# Patient Record
Sex: Female | Born: 1950 | Race: White | Hispanic: No | Smoking: Never smoker
Health system: Southern US, Community
[De-identification: ages and names within clinical notes are randomized; demographics above are authoritative.]

## PROBLEM LIST (undated history)

## (undated) DIAGNOSIS — K219 Gastro-esophageal reflux disease without esophagitis: Secondary | ICD-10-CM

## (undated) DIAGNOSIS — R112 Nausea with vomiting, unspecified: Secondary | ICD-10-CM

## (undated) DIAGNOSIS — R001 Bradycardia, unspecified: Secondary | ICD-10-CM

## (undated) DIAGNOSIS — E119 Type 2 diabetes mellitus without complications: Secondary | ICD-10-CM

## (undated) DIAGNOSIS — R0609 Other forms of dyspnea: Secondary | ICD-10-CM

## (undated) DIAGNOSIS — R790 Abnormal level of blood mineral: Secondary | ICD-10-CM

## (undated) DIAGNOSIS — F319 Bipolar disorder, unspecified: Secondary | ICD-10-CM

## (undated) DIAGNOSIS — I10 Essential (primary) hypertension: Secondary | ICD-10-CM

## (undated) DIAGNOSIS — F419 Anxiety disorder, unspecified: Secondary | ICD-10-CM

## (undated) DIAGNOSIS — I2510 Atherosclerotic heart disease of native coronary artery without angina pectoris: Secondary | ICD-10-CM

## (undated) DIAGNOSIS — E041 Nontoxic single thyroid nodule: Secondary | ICD-10-CM

## (undated) DIAGNOSIS — I491 Atrial premature depolarization: Secondary | ICD-10-CM

## (undated) DIAGNOSIS — Z9889 Other specified postprocedural states: Secondary | ICD-10-CM

## (undated) DIAGNOSIS — G930 Cerebral cysts: Secondary | ICD-10-CM

## (undated) DIAGNOSIS — E538 Deficiency of other specified B group vitamins: Secondary | ICD-10-CM

## (undated) DIAGNOSIS — I493 Ventricular premature depolarization: Secondary | ICD-10-CM

## (undated) DIAGNOSIS — Z87442 Personal history of urinary calculi: Secondary | ICD-10-CM

## (undated) DIAGNOSIS — R519 Headache, unspecified: Secondary | ICD-10-CM

## (undated) DIAGNOSIS — G4730 Sleep apnea, unspecified: Secondary | ICD-10-CM

## (undated) DIAGNOSIS — K760 Fatty (change of) liver, not elsewhere classified: Secondary | ICD-10-CM

## (undated) DIAGNOSIS — E7800 Pure hypercholesterolemia, unspecified: Secondary | ICD-10-CM

## (undated) DIAGNOSIS — R0600 Dyspnea, unspecified: Secondary | ICD-10-CM

## (undated) DIAGNOSIS — F32A Depression, unspecified: Secondary | ICD-10-CM

## (undated) DIAGNOSIS — R002 Palpitations: Secondary | ICD-10-CM

## (undated) DIAGNOSIS — R42 Dizziness and giddiness: Secondary | ICD-10-CM

## (undated) DIAGNOSIS — J45909 Unspecified asthma, uncomplicated: Secondary | ICD-10-CM

## (undated) DIAGNOSIS — M1990 Unspecified osteoarthritis, unspecified site: Secondary | ICD-10-CM

## (undated) HISTORY — PX: CHOLECYSTECTOMY: SHX55

## (undated) HISTORY — DX: Pure hypercholesterolemia, unspecified: E78.00

## (undated) HISTORY — PX: BRAIN SURGERY: SHX531

## (undated) HISTORY — PX: TONSILLECTOMY: SUR1361

## (undated) HISTORY — DX: Gastro-esophageal reflux disease without esophagitis: K21.9

## (undated) HISTORY — PX: KIDNEY STONE SURGERY: SHX686

## (undated) HISTORY — PX: LASIK: SHX215

## (undated) HISTORY — PX: CRANIOTOMY: SHX93

## (undated) HISTORY — PX: OTHER SURGICAL HISTORY: SHX169

## (undated) HISTORY — PX: ELBOW SURGERY: SHX618

## (undated) HISTORY — PX: JOINT REPLACEMENT: SHX530

## (undated) HISTORY — PX: ABDOMINAL HYSTERECTOMY: SHX81

---

## 1974-09-05 HISTORY — PX: TUBAL LIGATION: SHX77

## 1996-09-05 HISTORY — PX: BREAST SURGERY: SHX581

## 2004-09-05 HISTORY — PX: COLONOSCOPY: SHX174

## 2005-01-13 ENCOUNTER — Ambulatory Visit: Payer: Self-pay | Admitting: Family Medicine

## 2005-01-13 ENCOUNTER — Encounter: Payer: Self-pay | Admitting: Internal Medicine

## 2005-08-05 ENCOUNTER — Ambulatory Visit: Payer: Self-pay | Admitting: Gastroenterology

## 2006-03-29 ENCOUNTER — Ambulatory Visit: Payer: Self-pay | Admitting: Internal Medicine

## 2007-01-17 ENCOUNTER — Emergency Department: Payer: Self-pay | Admitting: Unknown Physician Specialty

## 2007-01-17 ENCOUNTER — Other Ambulatory Visit: Payer: Self-pay

## 2007-01-23 ENCOUNTER — Telehealth (INDEPENDENT_AMBULATORY_CARE_PROVIDER_SITE_OTHER): Payer: Self-pay | Admitting: *Deleted

## 2007-02-01 ENCOUNTER — Ambulatory Visit: Payer: Self-pay | Admitting: Internal Medicine

## 2007-02-01 DIAGNOSIS — E785 Hyperlipidemia, unspecified: Secondary | ICD-10-CM

## 2007-02-01 DIAGNOSIS — I10 Essential (primary) hypertension: Secondary | ICD-10-CM | POA: Insufficient documentation

## 2007-02-01 DIAGNOSIS — R609 Edema, unspecified: Secondary | ICD-10-CM

## 2007-02-01 DIAGNOSIS — E042 Nontoxic multinodular goiter: Secondary | ICD-10-CM

## 2007-02-01 DIAGNOSIS — G43009 Migraine without aura, not intractable, without status migrainosus: Secondary | ICD-10-CM | POA: Insufficient documentation

## 2007-02-02 LAB — CONVERTED CEMR LAB
Albumin: 4.1 g/dL (ref 3.5–5.2)
BUN: 11 mg/dL (ref 6–23)
GFR calc Af Amer: 112 mL/min
GFR calc non Af Amer: 93 mL/min
Hgb A1c MFr Bld: 6.1 % — ABNORMAL HIGH (ref 4.6–6.0)
Phosphorus: 4.5 mg/dL (ref 2.3–4.6)
Potassium: 4.5 meq/L (ref 3.5–5.1)
Total CHOL/HDL Ratio: 4.8
Triglycerides: 297 mg/dL (ref 0–149)

## 2007-02-13 ENCOUNTER — Ambulatory Visit: Payer: Self-pay | Admitting: Internal Medicine

## 2007-02-26 ENCOUNTER — Encounter (INDEPENDENT_AMBULATORY_CARE_PROVIDER_SITE_OTHER): Payer: Self-pay | Admitting: *Deleted

## 2007-07-09 ENCOUNTER — Telehealth: Payer: Self-pay | Admitting: Internal Medicine

## 2007-07-09 DIAGNOSIS — N200 Calculus of kidney: Secondary | ICD-10-CM

## 2007-07-11 ENCOUNTER — Encounter: Payer: Self-pay | Admitting: Internal Medicine

## 2007-08-06 ENCOUNTER — Ambulatory Visit: Payer: Self-pay | Admitting: Internal Medicine

## 2007-08-20 ENCOUNTER — Ambulatory Visit: Payer: Self-pay | Admitting: Internal Medicine

## 2007-08-20 DIAGNOSIS — R42 Dizziness and giddiness: Secondary | ICD-10-CM

## 2007-09-12 ENCOUNTER — Ambulatory Visit: Payer: Self-pay | Admitting: Internal Medicine

## 2007-09-13 LAB — CONVERTED CEMR LAB
Albumin: 3.7 g/dL (ref 3.5–5.2)
Creatinine, Ser: 0.7 mg/dL (ref 0.4–1.2)
GFR calc Af Amer: 112 mL/min
GFR calc non Af Amer: 93 mL/min
LDL Cholesterol: 93 mg/dL (ref 0–99)
Phosphorus: 3.2 mg/dL (ref 2.3–4.6)
Potassium: 3.7 meq/L (ref 3.5–5.1)
Sodium: 141 meq/L (ref 135–145)
Total CHOL/HDL Ratio: 3.2
Triglycerides: 112 mg/dL (ref 0–149)
VLDL: 22 mg/dL (ref 0–40)

## 2007-09-30 ENCOUNTER — Emergency Department: Payer: Self-pay | Admitting: Emergency Medicine

## 2007-10-01 ENCOUNTER — Encounter: Admission: RE | Admit: 2007-10-01 | Discharge: 2007-10-01 | Payer: Self-pay | Admitting: Neurosurgery

## 2007-10-01 ENCOUNTER — Encounter: Payer: Self-pay | Admitting: Internal Medicine

## 2007-10-02 ENCOUNTER — Encounter: Payer: Self-pay | Admitting: Internal Medicine

## 2007-11-14 ENCOUNTER — Encounter: Payer: Self-pay | Admitting: Internal Medicine

## 2007-11-19 ENCOUNTER — Telehealth (INDEPENDENT_AMBULATORY_CARE_PROVIDER_SITE_OTHER): Payer: Self-pay | Admitting: *Deleted

## 2007-11-27 ENCOUNTER — Ambulatory Visit: Payer: Self-pay | Admitting: Internal Medicine

## 2007-11-27 DIAGNOSIS — G930 Cerebral cysts: Secondary | ICD-10-CM | POA: Insufficient documentation

## 2008-01-07 ENCOUNTER — Ambulatory Visit: Payer: Self-pay | Admitting: Internal Medicine

## 2008-01-07 DIAGNOSIS — R7301 Impaired fasting glucose: Secondary | ICD-10-CM

## 2008-02-25 ENCOUNTER — Telehealth (INDEPENDENT_AMBULATORY_CARE_PROVIDER_SITE_OTHER): Payer: Self-pay | Admitting: *Deleted

## 2008-02-27 ENCOUNTER — Ambulatory Visit: Payer: Self-pay | Admitting: Internal Medicine

## 2008-02-27 ENCOUNTER — Encounter: Payer: Self-pay | Admitting: Internal Medicine

## 2008-03-05 ENCOUNTER — Telehealth: Payer: Self-pay | Admitting: Internal Medicine

## 2008-03-05 DIAGNOSIS — R928 Other abnormal and inconclusive findings on diagnostic imaging of breast: Secondary | ICD-10-CM | POA: Insufficient documentation

## 2008-03-10 ENCOUNTER — Ambulatory Visit: Payer: Self-pay | Admitting: Internal Medicine

## 2008-03-10 ENCOUNTER — Encounter: Payer: Self-pay | Admitting: Internal Medicine

## 2008-03-11 ENCOUNTER — Encounter (INDEPENDENT_AMBULATORY_CARE_PROVIDER_SITE_OTHER): Payer: Self-pay | Admitting: *Deleted

## 2008-04-25 ENCOUNTER — Ambulatory Visit: Payer: Self-pay | Admitting: Family Medicine

## 2008-04-28 ENCOUNTER — Ambulatory Visit: Payer: Self-pay | Admitting: Internal Medicine

## 2008-04-28 DIAGNOSIS — S6980XA Other specified injuries of unspecified wrist, hand and finger(s), initial encounter: Secondary | ICD-10-CM

## 2008-04-28 DIAGNOSIS — S6990XA Unspecified injury of unspecified wrist, hand and finger(s), initial encounter: Secondary | ICD-10-CM | POA: Insufficient documentation

## 2008-05-02 ENCOUNTER — Ambulatory Visit: Payer: Self-pay | Admitting: Internal Medicine

## 2008-07-11 ENCOUNTER — Ambulatory Visit: Payer: Self-pay | Admitting: Internal Medicine

## 2008-07-11 DIAGNOSIS — N3000 Acute cystitis without hematuria: Secondary | ICD-10-CM | POA: Insufficient documentation

## 2008-07-11 LAB — CONVERTED CEMR LAB
Bilirubin Urine: NEGATIVE
Blood in Urine, dipstick: NEGATIVE
Urobilinogen, UA: 0.2
WBC Urine, dipstick: NEGATIVE

## 2008-07-14 LAB — CONVERTED CEMR LAB
Albumin: 4.2 g/dL (ref 3.5–5.2)
Alkaline Phosphatase: 78 units/L (ref 39–117)
CO2: 32 meq/L (ref 19–32)
Chloride: 103 meq/L (ref 96–112)
Creatinine, Ser: 0.8 mg/dL (ref 0.4–1.2)
GFR calc non Af Amer: 79 mL/min
LDL Cholesterol: 79 mg/dL (ref 0–99)
Sodium: 140 meq/L (ref 135–145)
Total CHOL/HDL Ratio: 3
Triglycerides: 90 mg/dL (ref 0–149)

## 2008-08-05 ENCOUNTER — Ambulatory Visit: Payer: Self-pay | Admitting: Internal Medicine

## 2008-08-06 ENCOUNTER — Telehealth: Payer: Self-pay | Admitting: Internal Medicine

## 2008-08-19 ENCOUNTER — Encounter: Payer: Self-pay | Admitting: Internal Medicine

## 2008-08-25 ENCOUNTER — Ambulatory Visit: Payer: Self-pay | Admitting: Internal Medicine

## 2008-08-25 DIAGNOSIS — G479 Sleep disorder, unspecified: Secondary | ICD-10-CM | POA: Insufficient documentation

## 2008-09-05 DIAGNOSIS — G930 Cerebral cysts: Secondary | ICD-10-CM

## 2008-09-05 HISTORY — DX: Cerebral cysts: G93.0

## 2008-09-05 HISTORY — PX: BRAIN SURGERY: SHX531

## 2008-10-09 ENCOUNTER — Ambulatory Visit: Payer: Self-pay | Admitting: Internal Medicine

## 2008-10-09 DIAGNOSIS — F32A Depression, unspecified: Secondary | ICD-10-CM | POA: Insufficient documentation

## 2008-10-09 DIAGNOSIS — F329 Major depressive disorder, single episode, unspecified: Secondary | ICD-10-CM

## 2008-10-09 DIAGNOSIS — R1013 Epigastric pain: Secondary | ICD-10-CM

## 2008-10-13 ENCOUNTER — Ambulatory Visit: Payer: Self-pay | Admitting: Internal Medicine

## 2008-10-16 ENCOUNTER — Encounter (INDEPENDENT_AMBULATORY_CARE_PROVIDER_SITE_OTHER): Payer: Self-pay | Admitting: *Deleted

## 2008-10-23 ENCOUNTER — Ambulatory Visit: Payer: Self-pay | Admitting: Internal Medicine

## 2008-10-29 ENCOUNTER — Encounter: Payer: Self-pay | Admitting: Internal Medicine

## 2008-10-29 ENCOUNTER — Emergency Department: Payer: Self-pay | Admitting: Internal Medicine

## 2008-10-31 ENCOUNTER — Ambulatory Visit: Payer: Self-pay | Admitting: Internal Medicine

## 2008-10-31 ENCOUNTER — Encounter: Payer: Self-pay | Admitting: Internal Medicine

## 2008-11-05 ENCOUNTER — Encounter: Payer: Self-pay | Admitting: Internal Medicine

## 2008-11-06 ENCOUNTER — Telehealth: Payer: Self-pay | Admitting: Internal Medicine

## 2008-11-10 ENCOUNTER — Ambulatory Visit: Payer: Self-pay | Admitting: Internal Medicine

## 2008-11-11 LAB — CONVERTED CEMR LAB
Free T4: 1 ng/dL (ref 0.6–1.6)
TSH: 1.13 microintl units/mL (ref 0.35–5.50)

## 2009-01-09 ENCOUNTER — Emergency Department: Payer: Self-pay | Admitting: Emergency Medicine

## 2009-01-13 ENCOUNTER — Ambulatory Visit: Payer: Self-pay | Admitting: Gastroenterology

## 2009-02-11 ENCOUNTER — Ambulatory Visit: Payer: Self-pay | Admitting: Family Medicine

## 2009-03-10 ENCOUNTER — Ambulatory Visit: Payer: Self-pay | Admitting: Family Medicine

## 2009-03-12 ENCOUNTER — Ambulatory Visit: Payer: Self-pay | Admitting: Gastroenterology

## 2009-03-19 ENCOUNTER — Ambulatory Visit: Payer: Self-pay | Admitting: Gastroenterology

## 2009-06-23 ENCOUNTER — Inpatient Hospital Stay: Payer: Self-pay | Admitting: Internal Medicine

## 2009-08-12 ENCOUNTER — Emergency Department: Payer: Self-pay | Admitting: Emergency Medicine

## 2009-09-03 ENCOUNTER — Emergency Department: Payer: Self-pay | Admitting: Emergency Medicine

## 2009-10-01 ENCOUNTER — Ambulatory Visit: Payer: Self-pay | Admitting: Neurology

## 2010-06-09 ENCOUNTER — Ambulatory Visit: Payer: Self-pay | Admitting: Family Medicine

## 2011-06-14 ENCOUNTER — Ambulatory Visit: Payer: Self-pay | Admitting: Family Medicine

## 2012-12-25 ENCOUNTER — Ambulatory Visit: Payer: Self-pay | Admitting: Family Medicine

## 2013-01-07 ENCOUNTER — Ambulatory Visit: Payer: Self-pay | Admitting: Family Medicine

## 2013-01-22 ENCOUNTER — Other Ambulatory Visit: Payer: Self-pay

## 2013-01-22 ENCOUNTER — Encounter: Payer: Self-pay | Admitting: General Surgery

## 2013-01-22 ENCOUNTER — Ambulatory Visit (INDEPENDENT_AMBULATORY_CARE_PROVIDER_SITE_OTHER): Payer: Medicare Other | Admitting: General Surgery

## 2013-01-22 VITALS — BP 140/76 | HR 72 | Resp 12 | Ht 64.0 in | Wt 150.0 lb

## 2013-01-22 DIAGNOSIS — N63 Unspecified lump in unspecified breast: Secondary | ICD-10-CM

## 2013-01-22 HISTORY — PX: BREAST BIOPSY: SHX20

## 2013-01-22 NOTE — Patient Instructions (Addendum)

## 2013-01-22 NOTE — Progress Notes (Signed)
Patient ID: Kayla Wade, female   DOB: [birth year], 62 y.o.   MRN: [id number]  Chief Complaint  Patient presents with  . abnormal mammogram    category 4 mammogram and ultrasound    HPI Kayla Wade is a 62 y.o. female who presents for an abnormal mammogram and ultrasound. The most recent mammogram was done on 01/07/13 cat 4. Patient states she get regular mammograms and perform self breast checks.Had a right breast biopsy 15 years ago.No family history of breast cancer. HPI  Past Medical History  Diagnosis Date  . High cholesterol   . Acid reflux     Past Surgical History  Procedure Laterality Date  . Brain surgery    . Tonsillectomy    . Abdominal hysterectomy    . Elbow surgery    . Cholecystectomy    . Breast surgery Right 1998    right breast biopsy    History reviewed. No pertinent family history.  Social History History  Substance Use Topics  . Smoking status: Never Smoker   . Smokeless tobacco: Never Used  . Alcohol Use: No    Allergies  Allergen Reactions  . Erythromycin Base   . Hydrocod Polst-Cpm Polst Er     Current Outpatient Prescriptions  Medication Sig Dispense Refill  . atorvastatin (LIPITOR) 20 MG tablet Take 20 mg by mouth daily.      . propranolol (INDERAL) 20 MG tablet Take 20 mg by mouth 3 (three) times daily.      . risperiDONE (RISPERDAL) 2 MG tablet Take 2 mg by mouth at bedtime.      . trihexyphenidyl (ARTANE) 5 MG tablet Take 5 mg by mouth 2 (two) times daily with a meal.       No current facility-administered medications for this visit.    Review of Systems Review of Systems  Constitutional: Negative.   Respiratory: Negative.   Cardiovascular: Negative.     Blood pressure 140/76, pulse 72, resp. rate 12, height 5\' 4"  (1.626 m), weight 150 lb (68.04 kg).  Physical Exam Physical Exam  Constitutional: She is oriented to person, place, and time. She appears well-developed and well-nourished.  Eyes: Conjunctivae are normal. No scleral  icterus.  Neck: Neck supple.  Cardiovascular: Normal rate, regular rhythm and normal heart sounds.   Pulmonary/Chest: Breath sounds normal. Right breast exhibits no inverted nipple, no mass, no nipple discharge, no skin change and no tenderness. Left breast exhibits no inverted nipple, no mass, no nipple discharge, no skin change and no tenderness. Breasts are symmetrical.  Abdominal: Soft. Bowel sounds are normal. There is no hepatosplenomegaly. There is no tenderness.  Lymphadenopathy:    She has no cervical adenopathy.    She has no axillary adenopathy.  Neurological: She is alert and oriented to person, place, and time.  Skin: Skin is warm and dry.    Data Reviewed Mammogram and [place] reviewed. Focal small mass with shadowing noted inferior left breast  Assessment    Left breasyt mass, needs core biopsy     Plan    Biopsy completed today with pt's consent      CORE BREAST BIOPSY REPORT  Name:  Kayla Wade DOB:  06-Jan-1951  Vital signs:BP 140/76  Pulse 72  Resp 12  Ht 5\' 4"  (1.626 m)  Wt 150 lb (68.04 kg)  BMI 25.73 kg/m2  Lesion:  Mass left breast  Location:  Left   5-6 o'clock position  Local anesthetic:   8 ml of 1% Xylocaine/0.5% Marcaine  Prep:  Chloroprep  Device:  14Gauge  Bard    Ultrasound guidance:  used Patient tolerance:Patient tolerated the procedure well   Approach: LM  Clip:used  Dressing: steristrips, telfa tegaderm  Ice pack applied. Written instructions provided to patient regarding wound care.   Patient advised that patient will be contacted by phone when pathology report is available.  Followup appointment to be scheduled after pathology.   CC: Kayla Fabry, MD, Kayla Wade G 01/23/2013, 5:21 PM

## 2013-01-23 ENCOUNTER — Encounter: Payer: Self-pay | Admitting: General Surgery

## 2013-01-23 DIAGNOSIS — N63 Unspecified lump in unspecified breast: Secondary | ICD-10-CM | POA: Insufficient documentation

## 2013-01-24 ENCOUNTER — Telehealth: Payer: Self-pay | Admitting: *Deleted

## 2013-01-24 ENCOUNTER — Other Ambulatory Visit: Payer: Self-pay | Admitting: *Deleted

## 2013-01-24 DIAGNOSIS — N63 Unspecified lump in unspecified breast: Secondary | ICD-10-CM

## 2013-01-24 LAB — PATHOLOGY

## 2013-01-24 NOTE — Telephone Encounter (Signed)
Message copied by Currie Paris on Thu Jan 24, 2013 12:47 PM ------      Message from: Kieth Brightly      Created: Thu Jan 24, 2013 11:14 AM       Inform pt pathology on core biopsy is benign. Need to see her back in 2 mos with left mammogram. ------

## 2013-01-24 NOTE — Telephone Encounter (Signed)
Notified patient as instructed, patient pleased. Discussed follow-up appointments, patient agrees Elon Jester will call with appointments, pt prefers any day but after 2pm

## 2013-01-24 NOTE — Progress Notes (Signed)
The patient has been asked to return to the office in two months for a unilateral left breast diagnostic mammogram. This has been arranged at [facility] for 03-27-13 at 2:30 pm. Patient to follow up in the office on 04-04-13. She is aware of dates, times, and instructions.

## 2013-03-27 ENCOUNTER — Ambulatory Visit: Payer: Self-pay | Admitting: General Surgery

## 2013-04-04 ENCOUNTER — Other Ambulatory Visit: Payer: Self-pay

## 2013-04-04 ENCOUNTER — Encounter: Payer: Self-pay | Admitting: General Surgery

## 2013-04-04 ENCOUNTER — Ambulatory Visit (INDEPENDENT_AMBULATORY_CARE_PROVIDER_SITE_OTHER): Payer: Medicare Other | Admitting: General Surgery

## 2013-04-04 VITALS — BP 116/78 | HR 62 | Resp 12 | Ht 64.0 in | Wt 149.0 lb

## 2013-04-04 DIAGNOSIS — N63 Unspecified lump in unspecified breast: Secondary | ICD-10-CM

## 2013-04-04 NOTE — Patient Instructions (Addendum)
Follow up appointment to be announced.  

## 2013-04-04 NOTE — Progress Notes (Signed)
Patient ID: Kayla Wade, female   DOB: 30-Mar-1951, 62 y.o.   MRN: [id number]  Chief Complaint  Patient presents with  . Follow-up    mammogram    HPI Kayla Wade is a 62 y.o. female.  Patient here today for follow up left mammogram on 03/27/13 . Patient states she is doing well.She had core biopsy of a vague mass 5-6 o'cl in left breast-benign finding.  HPI  Past Medical History  Diagnosis Date  . High cholesterol   . Acid reflux     Past Surgical History  Procedure Laterality Date  . Brain surgery    . Tonsillectomy    . Abdominal hysterectomy    . Elbow surgery    . Cholecystectomy    . Breast surgery Right 1998    right breast biopsy    No family history on file.  Social History History  Substance Use Topics  . Smoking status: Never Smoker   . Smokeless tobacco: Never Used  . Alcohol Use: No    Allergies  Allergen Reactions  . Erythromycin Base   . Hydrocod Polst-Cpm Polst Er     Current Outpatient Prescriptions  Medication Sig Dispense Refill  . atorvastatin (LIPITOR) 20 MG tablet Take 20 mg by mouth daily.      . propranolol (INDERAL) 20 MG tablet Take 20 mg by mouth 3 (three) times daily.      . risperiDONE (RISPERDAL) 2 MG tablet Take 2 mg by mouth at bedtime.      . trihexyphenidyl (ARTANE) 5 MG tablet Take 5 mg by mouth 2 (two) times daily with a meal.       No current facility-administered medications for this visit.    Review of Systems Review of Systems  There were no vitals taken for this visit.  Physical Exam Physical Exam  Constitutional: She appears well-developed and well-nourished.  Neck: Neck supple.  Pulmonary/Chest: Right breast exhibits no inverted nipple, no mass, no nipple discharge, no skin change and no tenderness. Left breast exhibits no inverted nipple, no mass, no nipple discharge, no skin change and no tenderness.  Lymphadenopathy:    She has no cervical adenopathy.    She has no axillary adenopathy.    Data Reviewed Left  mammogram shows the clip but  Radiologist feels it is in different location -inferior as opposed inferior medial.  Assessment    [place] was repeated here today. There is a small shadowing area at 5-6 o'cl. This is the area biopsied. No findings noted 7 o'cl.location.     Plan    Will have [facility] repeat US and have Dr. Excell Seltzer from radiology rereview.         Dorathy Daft M 04/04/2013, 2:12 PM

## 2013-04-05 ENCOUNTER — Encounter: Payer: Self-pay | Admitting: General Surgery

## 2013-04-05 ENCOUNTER — Other Ambulatory Visit: Payer: Self-pay | Admitting: *Deleted

## 2013-04-05 ENCOUNTER — Telehealth: Payer: Self-pay | Admitting: *Deleted

## 2013-04-05 ENCOUNTER — Other Ambulatory Visit: Payer: Self-pay

## 2013-04-05 DIAGNOSIS — N63 Unspecified lump in unspecified breast: Secondary | ICD-10-CM

## 2013-04-05 NOTE — Telephone Encounter (Signed)
Patient has been scheduled for a left breast ultrasound at the [facility] for 04-08-13 at 2:30 pm. She is aware of date, time, and instructions.

## 2013-04-08 ENCOUNTER — Ambulatory Visit: Payer: Self-pay | Admitting: General Surgery

## 2013-04-10 ENCOUNTER — Encounter: Payer: Self-pay | Admitting: General Surgery

## 2013-08-06 ENCOUNTER — Ambulatory Visit: Payer: Self-pay | Admitting: General Surgery

## 2013-08-14 ENCOUNTER — Ambulatory Visit (INDEPENDENT_AMBULATORY_CARE_PROVIDER_SITE_OTHER): Payer: Medicare Other | Admitting: General Surgery

## 2013-08-14 ENCOUNTER — Encounter: Payer: Self-pay | Admitting: General Surgery

## 2013-08-14 VITALS — BP 154/84 | HR 58 | Resp 12 | Ht 64.0 in | Wt 154.0 lb

## 2013-08-14 DIAGNOSIS — N63 Unspecified lump in unspecified breast: Secondary | ICD-10-CM

## 2013-08-14 DIAGNOSIS — R928 Other abnormal and inconclusive findings on diagnostic imaging of breast: Secondary | ICD-10-CM

## 2013-08-14 NOTE — Patient Instructions (Signed)
Bilateral screening mammogram and office visit in April 2015. Continue self breast exams. Call office for any new breast issues or concerns.

## 2013-08-14 NOTE — Progress Notes (Signed)
Patient ID: Kayla Wade, female   DOB: 04-Nov-1950, 62 y.o.   MRN: [id number]  Chief Complaint  Patient presents with  . Follow-up    mammogram    HPI Kayla Wade is a 62 y.o. female.  who presents for her follow up breast evaluation. The most recent left  mammogram was done on 08-06-13.  Patient does perform regular self breast checks and gets regular mammograms done.  No new breast complaints.   HPI  Past Medical History  Diagnosis Date  . High cholesterol   . Acid reflux     Past Surgical History  Procedure Laterality Date  . Brain surgery    . Tonsillectomy    . Abdominal hysterectomy    . Elbow surgery    . Cholecystectomy    . Breast surgery Right 1998    right breast biopsy  . Breast biopsy Left Jan 22 2013    BENIGN BREAST TISSUE WITH FIBROCYSTIC CHANGES INCLUDING USUAL    History reviewed. No pertinent family history.  Social History History  Substance Use Topics  . Smoking status: Never Smoker   . Smokeless tobacco: Never Used  . Alcohol Use: No    Allergies  Allergen Reactions  . Erythromycin Base   . Hydrocod Polst-Cpm Polst Er     Current Outpatient Prescriptions  Medication Sig Dispense Refill  . atorvastatin (LIPITOR) 20 MG tablet Take 20 mg by mouth daily.      . propranolol (INDERAL) 20 MG tablet Take 20 mg by mouth 3 (three) times daily.      . risperiDONE (RISPERDAL) 2 MG tablet Take 2 mg by mouth at bedtime.      . trihexyphenidyl (ARTANE) 5 MG tablet Take 5 mg by mouth 2 (two) times daily with a meal.       No current facility-administered medications for this visit.    Review of Systems Review of Systems  Constitutional: Negative.   Respiratory: Negative.   Cardiovascular: Negative.     Blood pressure 154/84, pulse 58, resp. rate 12, height 5\' 4"  (1.626 m), weight 154 lb (69.854 kg).  Physical Exam Physical Exam  Constitutional: She is oriented to person, place, and time. She appears well-developed and well-nourished.  Eyes:  Conjunctivae are normal. No scleral icterus.  Neck: Neck supple.  Cardiovascular: Normal rate, regular rhythm and normal heart sounds.   Pulmonary/Chest: Effort normal and breath sounds normal. Right breast exhibits no inverted nipple, no mass, no nipple discharge, no skin change and no tenderness. Left breast exhibits no inverted nipple, no mass, no nipple discharge, no skin change and no tenderness.  Lymphadenopathy:    She has no cervical adenopathy.    She has no axillary adenopathy.  Neurological: She is alert and oriented to person, place, and time.  Skin: Skin is warm and dry.    Data Reviewed Left mammogram reviewed and stable.  Assessment    Stable physical exam.    Plan    Bilateral screening mammogram and office visit in April 2015.       Tamyrah Burbage G 08/14/2013, 6:42 PM

## 2014-01-01 ENCOUNTER — Ambulatory Visit: Payer: Self-pay | Admitting: General Surgery

## 2014-01-03 ENCOUNTER — Encounter: Payer: Self-pay | Admitting: General Surgery

## 2014-01-06 ENCOUNTER — Ambulatory Visit: Payer: Self-pay | Admitting: General Surgery

## 2014-01-06 ENCOUNTER — Encounter: Payer: Self-pay | Admitting: General Surgery

## 2014-01-14 ENCOUNTER — Encounter: Payer: Self-pay | Admitting: General Surgery

## 2014-01-14 ENCOUNTER — Ambulatory Visit (INDEPENDENT_AMBULATORY_CARE_PROVIDER_SITE_OTHER): Payer: Medicare Other | Admitting: General Surgery

## 2014-01-14 VITALS — BP 140/84 | HR 80 | Resp 14 | Ht 64.0 in | Wt 159.0 lb

## 2014-01-14 DIAGNOSIS — R920 Mammographic microcalcification found on diagnostic imaging of breast: Secondary | ICD-10-CM

## 2014-01-14 DIAGNOSIS — N6019 Diffuse cystic mastopathy of unspecified breast: Secondary | ICD-10-CM

## 2014-01-14 NOTE — Progress Notes (Signed)
Patient ID: Kayla Wade, female   DOB: 02-09-1951, 63 y.o.   MRN: [id number]  Chief Complaint  Patient presents with  . Follow-up    mammogram    HPI Kayla Wade is a 63 y.o. female.  who presents for her follow up breast evaluation. The most recent mammogram was done on 01-06-14.  Patient does perform regular self breast checks and gets regular mammograms done.  Denies any new breast issues. I yr ago she had cre biopsy of left breast mass showing FC changes.  HPI  Past Medical History  Diagnosis Date  . High cholesterol   . Acid reflux     Past Surgical History  Procedure Laterality Date  . Brain surgery    . Tonsillectomy    . Abdominal hysterectomy    . Elbow surgery    . Cholecystectomy    . Breast surgery Right 1998    right breast biopsy  . Breast biopsy Left Jan 22 2013    BENIGN BREAST TISSUE WITH FIBROCYSTIC CHANGES INCLUDING USUAL  . Colonoscopy  2010    [facility]    History reviewed. No pertinent family history.  Social History History  Substance Use Topics  . Smoking status: Never Smoker   . Smokeless tobacco: Never Used  . Alcohol Use: No    Allergies  Allergen Reactions  . Hydrocod Polst-Cpm Polst Er Nausea Only    tussinex  . Erythromycin Base Rash    Current Outpatient Prescriptions  Medication Sig Dispense Refill  . atorvastatin (LIPITOR) 20 MG tablet Take 20 mg by mouth daily.      . propranolol (INDERAL) 20 MG tablet Take 20 mg by mouth 3 (three) times daily.      . risperiDONE (RISPERDAL) 2 MG tablet Take 2 mg by mouth at bedtime.      . trihexyphenidyl (ARTANE) 5 MG tablet Take 5 mg by mouth 2 (two) times daily with a meal.       No current facility-administered medications for this visit.    Review of Systems Review of Systems  Constitutional: Negative.   Respiratory: Negative.   Cardiovascular: Negative.     Blood pressure 140/84, pulse 80, resp. rate 14, height 5\' 4"  (1.626 m), weight 159 lb (72.122 kg).  Physical  Exam Physical Exam  Constitutional: She is oriented to person, place, and time. She appears well-developed and well-nourished.  Eyes: Conjunctivae are normal.  Neck: Neck supple.  Cardiovascular: Normal rate, regular rhythm and normal heart sounds.   Pulmonary/Chest: Effort normal and breath sounds normal. Right breast exhibits no inverted nipple, no mass, no nipple discharge, no skin change and no tenderness. Left breast exhibits no inverted nipple, no mass, no nipple discharge, no skin change and no tenderness.  Abdominal: Soft. There is no tenderness.  Lymphadenopathy:    She has no cervical adenopathy.    She has no axillary adenopathy.  Neurological: She is alert and oriented to person, place, and time.  Skin: Skin is warm and dry.    Data Reviewed Mammogram reviewed shows new microcalcification right breast that appears benign but needs follow up.  Assessment    Stable physical exam. New area of micro calcifications in right breast. Advised these appear benign but need a closer f/u.     Plan    Follow up in 6 months with a right breast diagnostic mammogram and office visit.       Seeplaputhur G Sankar 01/15/2014, 5:34 PM

## 2014-01-14 NOTE — Patient Instructions (Signed)
Follow up in 6 months with a right breast diagnostic mammogram and office visit Continue self breast exams. Call office for any new breast issues or concerns.

## 2014-01-15 ENCOUNTER — Encounter: Payer: Self-pay | Admitting: General Surgery

## 2014-07-07 ENCOUNTER — Encounter: Payer: Self-pay | Admitting: General Surgery

## 2014-07-14 ENCOUNTER — Ambulatory Visit: Payer: Self-pay | Admitting: General Surgery

## 2014-07-14 ENCOUNTER — Encounter: Payer: Self-pay | Admitting: General Surgery

## 2014-07-22 ENCOUNTER — Ambulatory Visit (INDEPENDENT_AMBULATORY_CARE_PROVIDER_SITE_OTHER): Payer: Medicare Other | Admitting: General Surgery

## 2014-07-22 ENCOUNTER — Encounter: Payer: Self-pay | Admitting: General Surgery

## 2014-07-22 VITALS — BP 130/74 | HR 74 | Resp 12 | Ht 64.0 in | Wt 162.0 lb

## 2014-07-22 DIAGNOSIS — R928 Other abnormal and inconclusive findings on diagnostic imaging of breast: Secondary | ICD-10-CM

## 2014-07-22 NOTE — Patient Instructions (Addendum)
Patient will be asked to return to the office in six months with a bilateral screening mammogram. Continue self breast exams. Call office for any new breast issues or concerns.

## 2014-07-22 NOTE — Progress Notes (Signed)
Patient ID: Kayla Wade, female   DOB: 10-Jun-1951, 63 y.o.   MRN: [id number]  Chief Complaint  Patient presents with  . Follow-up    mammogram    HPI Kayla Wade is a 63 y.o. female who presents for a breast evaluation. The most recent right breast mammogram was done on 07/14/14 Patient does perform regular self breast checks and gets regular mammograms done.    HPI  Past Medical History  Diagnosis Date  . High cholesterol   . Acid reflux     Past Surgical History  Procedure Laterality Date  . Brain surgery    . Tonsillectomy    . Abdominal hysterectomy    . Elbow surgery    . Cholecystectomy    . Breast surgery Right 1998    right breast biopsy  . Breast biopsy Left Jan 22 2013    BENIGN BREAST TISSUE WITH FIBROCYSTIC CHANGES INCLUDING USUAL  . Colonoscopy  2010    [facility]    History reviewed. No pertinent family history.  Social History History  Substance Use Topics  . Smoking status: Never Smoker   . Smokeless tobacco: Never Used  . Alcohol Use: No    Allergies  Allergen Reactions  . Hydrocod Polst-Cpm Polst Er Nausea Only    tussinex  . Erythromycin Base Rash    Current Outpatient Prescriptions  Medication Sig Dispense Refill  . atorvastatin (LIPITOR) 20 MG tablet Take 20 mg by mouth daily.    . propranolol (INDERAL) 20 MG tablet Take 20 mg by mouth 3 (three) times daily.    . risperiDONE (RISPERDAL) 2 MG tablet Take 2 mg by mouth at bedtime.    . trihexyphenidyl (ARTANE) 5 MG tablet Take 5 mg by mouth 2 (two) times daily with a meal.     No current facility-administered medications for this visit.    Review of Systems Review of Systems  Constitutional: Negative.   Respiratory: Negative.   Cardiovascular: Negative.     Blood pressure 130/74, pulse 74, resp. rate 12, height 5\' 4"  (1.626 m), weight 162 lb (73.483 kg).  Physical Exam Physical Exam  Constitutional: She is oriented to person, place, and time. She appears well-developed  and well-nourished.  Eyes: Conjunctivae are normal. No scleral icterus.  Neck: Neck supple.  Cardiovascular: Normal rate, regular rhythm and normal heart sounds.   Pulmonary/Chest: Effort normal and breath sounds normal. Right breast exhibits no inverted nipple, no mass, no nipple discharge, no skin change and no tenderness. Left breast exhibits no inverted nipple, no mass, no nipple discharge, no skin change and no tenderness.  Abdominal: Soft. Bowel sounds are normal. There is no tenderness.  Lymphadenopathy:    She has no cervical adenopathy.    She has no axillary adenopathy.  Neurological: She is alert and oriented to person, place, and time.  Skin: Skin is warm and dry.    Data Reviewed Mammogram right reviewed . No changes noticed    Assessment Stable exam. This was a six month follow up for questionable calcification right breast which appears benign now.      Plan    Patient will be asked to return to the office in six monthsr with a bilateral screening mammogram.    Junie Panning G 07/22/2014, 7:22 PM

## 2014-12-18 ENCOUNTER — Other Ambulatory Visit: Payer: Self-pay

## 2014-12-18 DIAGNOSIS — Z1231 Encounter for screening mammogram for malignant neoplasm of breast: Secondary | ICD-10-CM

## 2015-01-06 ENCOUNTER — Ambulatory Visit
Admission: RE | Admit: 2015-01-06 | Discharge: 2015-01-06 | Disposition: A | Discharge status: Home / Self Care | Payer: Medicare Other | Source: Ambulatory Visit | Attending: General Surgery | Admitting: General Surgery

## 2015-01-06 DIAGNOSIS — Z1231 Encounter for screening mammogram for malignant neoplasm of breast: Secondary | ICD-10-CM | POA: Insufficient documentation

## 2015-01-15 ENCOUNTER — Ambulatory Visit (INDEPENDENT_AMBULATORY_CARE_PROVIDER_SITE_OTHER): Payer: Medicare Other | Admitting: General Surgery

## 2015-01-15 ENCOUNTER — Encounter: Payer: Self-pay | Admitting: General Surgery

## 2015-01-15 VITALS — BP 142/78 | HR 54 | Resp 12 | Ht 64.0 in | Wt 162.0 lb

## 2015-01-15 DIAGNOSIS — N6012 Diffuse cystic mastopathy of left breast: Secondary | ICD-10-CM | POA: Diagnosis not present

## 2015-01-15 NOTE — Patient Instructions (Signed)
Continue self breast exams. Call office for any new breast issues or concerns.The patient has been asked to return to the office in one year with a bilateral screening mammogram. 

## 2015-01-15 NOTE — Progress Notes (Signed)
Patient ID: Kayla Wade, female   DOB: [birth year], 64 y.o.   MRN: [id number]  Chief Complaint  Patient presents with  . Follow-up    mammogram    HPI Kayla Wade is a 64 y.o. female who presents for a breast evaluation. The most recent mammogram was done on 01/06/15 .  Patient does perform regular self breast checks and gets regular mammograms done.    HPI  Past Medical History  Diagnosis Date  . High cholesterol   . Acid reflux     Past Surgical History  Procedure Laterality Date  . Brain surgery    . Tonsillectomy    . Abdominal hysterectomy    . Elbow surgery    . Cholecystectomy    . Breast surgery Right 1998    right breast biopsy  . Breast biopsy Left Jan 22 2013    BENIGN BREAST TISSUE WITH FIBROCYSTIC CHANGES INCLUDING USUAL  . Colonoscopy  2010    [facility]    History reviewed. No pertinent family history.  Social History History  Substance Use Topics  . Smoking status: Never Smoker   . Smokeless tobacco: Never Used  . Alcohol Use: No    Allergies  Allergen Reactions  . Hydrocod Polst-Cpm Polst Er Nausea Only    tussinex  . Erythromycin Base Rash    Current Outpatient Prescriptions  Medication Sig Dispense Refill  . atorvastatin (LIPITOR) 20 MG tablet Take 20 mg by mouth daily.    . propranolol (INDERAL) 20 MG tablet Take 20 mg by mouth 3 (three) times daily.    . risperiDONE (RISPERDAL) 2 MG tablet Take 2 mg by mouth at bedtime.    . trihexyphenidyl (ARTANE) 5 MG tablet Take 5 mg by mouth 2 (two) times daily with a meal.     No current facility-administered medications for this visit.    Review of Systems Review of Systems  Constitutional: Negative.   Respiratory: Negative.   Cardiovascular: Negative.     Blood pressure 142/78, pulse 54, resp. rate 12, height 5\' 4"  (1.626 m), weight 162 lb (73.483 kg).  Physical Exam Physical Exam  Constitutional: She is oriented to person, place, and time. She appears well-developed and  well-nourished.  Eyes: Conjunctivae are normal. No scleral icterus.  Neck: Neck supple.  Cardiovascular: Normal rate, regular rhythm and normal heart sounds.   Pulmonary/Chest: Effort normal and breath sounds normal. Right breast exhibits no inverted nipple, no mass, no nipple discharge, no skin change and no tenderness. Left breast exhibits no inverted nipple, no mass, no nipple discharge, no skin change and no tenderness.  Lymphadenopathy:    She has no cervical adenopathy.    She has no axillary adenopathy.  Neurological: She is alert and oriented to person, place, and time.  Skin: Skin is dry.    Data Reviewed Mammogram reviewed    Assessment    Stable physical exam. FCD by prior left breast biopsy.       Plan    Patient will be asked to return to the office in one year with a bilateral screening mammogram. Continue self breast exams. Call office for any new breast issues or concerns.     PCP:  Payton Doughty 01/15/2015, 4:29 PM

## 2015-04-03 ENCOUNTER — Other Ambulatory Visit: Payer: Self-pay | Admitting: Family Medicine

## 2015-04-03 DIAGNOSIS — Z1239 Encounter for other screening for malignant neoplasm of breast: Secondary | ICD-10-CM

## 2015-10-29 ENCOUNTER — Other Ambulatory Visit: Payer: Self-pay | Admitting: *Deleted

## 2015-10-29 DIAGNOSIS — Z1231 Encounter for screening mammogram for malignant neoplasm of breast: Secondary | ICD-10-CM

## 2015-11-17 ENCOUNTER — Encounter: Payer: Self-pay | Admitting: *Deleted

## 2016-01-07 ENCOUNTER — Ambulatory Visit
Admission: RE | Admit: 2016-01-07 | Discharge: 2016-01-07 | Disposition: A | Discharge status: Home / Self Care | Payer: Medicare Other | Source: Ambulatory Visit | Attending: General Surgery | Admitting: General Surgery

## 2016-01-07 DIAGNOSIS — Z1231 Encounter for screening mammogram for malignant neoplasm of breast: Secondary | ICD-10-CM | POA: Diagnosis present

## 2016-01-14 ENCOUNTER — Ambulatory Visit (INDEPENDENT_AMBULATORY_CARE_PROVIDER_SITE_OTHER): Payer: Medicare Other | Admitting: General Surgery

## 2016-01-14 VITALS — BP 124/76 | HR 70 | Resp 12 | Ht 64.0 in | Wt 154.0 lb

## 2016-01-14 DIAGNOSIS — N6012 Diffuse cystic mastopathy of left breast: Secondary | ICD-10-CM

## 2016-01-14 NOTE — Patient Instructions (Signed)
Patient will be asked to return to the office in one year with a bilateral screening mammogram.  Continue self breast exams. Call office for any new breast issues or concerns.  

## 2016-01-14 NOTE — Progress Notes (Signed)
Patient ID: Kayla Wade, female   DOB: May 23, 1951, 65 y.o.   MRN: [id number]  Chief Complaint  Patient presents with  . Follow-up    mammogram    HPI Kayla Wade is a 65 y.o. female who presents for a breast evaluation. The most recent mammogram was done on 01/07/16.  Patient does perform regular self breast checks and gets regular mammograms done.   I have reviewed the history of present illness with the patient.  HPI  Past Medical History  Diagnosis Date  . High cholesterol   . Acid reflux     Past Surgical History  Procedure Laterality Date  . Brain surgery    . Tonsillectomy    . Abdominal hysterectomy    . Elbow surgery    . Cholecystectomy    . Breast surgery Right 1998    right breast biopsy  . Breast biopsy Left Jan 22 2013    BENIGN BREAST TISSUE WITH FIBROCYSTIC CHANGES INCLUDING USUAL  . Colonoscopy  2010    [facility]    History reviewed. No pertinent family history.  Social History Social History  Substance Use Topics  . Smoking status: Never Smoker   . Smokeless tobacco: Never Used  . Alcohol Use: No    Allergies  Allergen Reactions  . Hydrocod Polst-Cpm Polst Er Nausea Only    tussinex  . Erythromycin Base Rash    Current Outpatient Prescriptions  Medication Sig Dispense Refill  . metFORMIN (GLUCOPHAGE-XR) 500 MG 24 hr tablet Take by mouth.    Marland Kitchen atorvastatin (LIPITOR) 20 MG tablet Take 20 mg by mouth daily.    . propranolol (INDERAL) 20 MG tablet Take 20 mg by mouth 3 (three) times daily.    . risperiDONE (RISPERDAL) 2 MG tablet Take 2 mg by mouth at bedtime.    . trihexyphenidyl (ARTANE) 5 MG tablet Take 5 mg by mouth 2 (two) times daily with a meal.     No current facility-administered medications for this visit.    Review of Systems Review of Systems  Constitutional: Negative.   Respiratory: Negative.   Cardiovascular: Negative.     Blood pressure 124/76, pulse 70, resp. rate 12, height 5\' 4"  (1.626 m), weight 154 lb (69.854  kg).  Physical Exam Physical Exam  Constitutional: She is oriented to person, place, and time. She appears well-developed and well-nourished.  Eyes: Conjunctivae are normal. No scleral icterus.  Neck: Neck supple.  Cardiovascular: Normal rate, regular rhythm and normal heart sounds.   Pulmonary/Chest: Effort normal and breath sounds normal. Right breast exhibits no inverted nipple, no mass, no nipple discharge, no skin change and no tenderness. Left breast exhibits no inverted nipple, no mass, no nipple discharge, no skin change and no tenderness.  Abdominal: Soft. Bowel sounds are normal. There is no tenderness.  Lymphadenopathy:    She has no cervical adenopathy.    She has no axillary adenopathy.  Neurological: She is alert and oriented to person, place, and time.  Skin: Skin is warm and dry.    Data Reviewed Mammogram reviewed and stable    Assessment    Stable exam. FCD by prior biopsy     Plan    Patient will be asked to return to the office in one year with a bilateral screening mammogram.      PCP:  Gayland Curry This information has been scribed by Gaspar Cola CMA.   SANKAR,SEEPLAPUTHUR G 01/18/2016, 8:14 AM

## 2016-01-18 ENCOUNTER — Encounter: Payer: Self-pay | Admitting: General Surgery

## 2016-11-04 ENCOUNTER — Other Ambulatory Visit: Payer: Self-pay

## 2016-11-04 DIAGNOSIS — Z1231 Encounter for screening mammogram for malignant neoplasm of breast: Secondary | ICD-10-CM

## 2017-01-06 ENCOUNTER — Ambulatory Visit
Admission: RE | Admit: 2017-01-06 | Discharge: 2017-01-06 | Disposition: A | Discharge status: Home / Self Care | Payer: Medicare Other | Source: Ambulatory Visit | Attending: General Surgery | Admitting: General Surgery

## 2017-01-06 DIAGNOSIS — Z1231 Encounter for screening mammogram for malignant neoplasm of breast: Secondary | ICD-10-CM | POA: Insufficient documentation

## 2017-01-09 ENCOUNTER — Encounter: Payer: Self-pay | Admitting: *Deleted

## 2017-01-12 ENCOUNTER — Encounter: Payer: Self-pay | Admitting: General Surgery

## 2017-01-12 ENCOUNTER — Ambulatory Visit (INDEPENDENT_AMBULATORY_CARE_PROVIDER_SITE_OTHER): Payer: Medicare Other | Admitting: General Surgery

## 2017-01-12 VITALS — BP 132/78 | HR 75 | Resp 12 | Ht 64.0 in | Wt 167.0 lb

## 2017-01-12 DIAGNOSIS — N6012 Diffuse cystic mastopathy of left breast: Secondary | ICD-10-CM

## 2017-01-12 NOTE — Patient Instructions (Signed)
Patient to return to her PCP for yearly mammograms and breast checks.

## 2017-01-12 NOTE — Progress Notes (Signed)
Patient ID: Kayla Wade, female   DOB: February 18, 1951, 66 y.o.   MRN: [id number]  Chief Complaint  Patient presents with  . Follow-up    HPI Kayla Wade is a 66 y.o. female who presents for a breast evaluation. The most recent mammogram was done on 01/06/2017.  Patient does perform regular self breast checks and gets regular mammograms done.    HPI  Past Medical History:  Diagnosis Date  . Acid reflux   . High cholesterol     Past Surgical History:  Procedure Laterality Date  . ABDOMINAL HYSTERECTOMY    . BRAIN SURGERY    . BREAST BIOPSY Left Jan 22 2013   BENIGN BREAST TISSUE WITH FIBROCYSTIC CHANGES INCLUDING USUAL  . BREAST SURGERY Right 1998   right breast biopsy  . CHOLECYSTECTOMY    . COLONOSCOPY  2010   [facility]  . ELBOW SURGERY    . TONSILLECTOMY      Family History  Problem Relation Age of Onset  . Lung cancer Sister     Social History Social History  Substance Use Topics  . Smoking status: Never Smoker  . Smokeless tobacco: Never Used  . Alcohol use No    Allergies  Allergen Reactions  . Hydrocod Polst-Cpm Polst Er Nausea Only    tussinex  . Erythromycin Base Rash    Current Outpatient Prescriptions  Medication Sig Dispense Refill  . atorvastatin (LIPITOR) 20 MG tablet Take 20 mg by mouth daily.    . propranolol (INDERAL) 20 MG tablet Take 20 mg by mouth 3 (three) times daily.    . risperiDONE (RISPERDAL) 2 MG tablet Take 2 mg by mouth at bedtime.    . trihexyphenidyl (ARTANE) 5 MG tablet Take 5 mg by mouth 2 (two) times daily with a meal.    . metFORMIN (GLUCOPHAGE-XR) 500 MG 24 hr tablet Take by mouth.     No current facility-administered medications for this visit.     Review of Systems Review of Systems  Constitutional: Negative.   Respiratory: Negative.   Cardiovascular: Negative.     Blood pressure 132/78, pulse 75, resp. rate 12, height 5\' 4"  (1.626 m), weight 167 lb (75.8 kg).  Physical Exam Physical Exam  Constitutional:  She is oriented to person, place, and time. She appears well-developed and well-nourished.  Eyes: Conjunctivae are normal. No scleral icterus.  Neck: Neck supple.  Cardiovascular: Normal rate, regular rhythm and normal heart sounds.   Pulmonary/Chest: Effort normal and breath sounds normal. Right breast exhibits no inverted nipple, no mass, no nipple discharge, no skin change and no tenderness. Left breast exhibits no inverted nipple, no mass, no nipple discharge, no skin change and no tenderness. Breasts are symmetrical.  Abdominal: Soft. Bowel sounds are normal. There is no tenderness.  Lymphadenopathy:    She has no cervical adenopathy.    She has no axillary adenopathy.  Neurological: She is alert and oriented to person, place, and time.  Skin: Skin is warm and dry.    Data Reviewed Mammogram reviewed   Assessment    Stable exam. FCD by prior biopsy     Plan    Patient to return to her PCP for yearly mammograms and breast checks.  Colonoscopy is due 2020.  HPI, Physical Exam, Assessment and Plan have been scribed under the direction and in the presence of Mckinley Jewel, MD  Gaspar Cola, CMA  I have completed the exam and reviewed the above documentation for accuracy and completeness.  I agree with the above.  Haematologist has been used and any errors in dictation or transcription are unintentional.  Suman Trivedi G. Jamal Collin, M.D., F.A.C.S.  Junie Panning G 01/13/2017, 8:45 AM

## 2017-10-16 ENCOUNTER — Other Ambulatory Visit: Payer: Self-pay | Admitting: Family Medicine

## 2017-10-16 DIAGNOSIS — Z1239 Encounter for other screening for malignant neoplasm of breast: Secondary | ICD-10-CM

## 2018-03-15 ENCOUNTER — Ambulatory Visit
Admission: RE | Admit: 2018-03-15 | Discharge: 2018-03-15 | Disposition: A | Discharge status: Home / Self Care | Payer: Medicare HMO | Source: Ambulatory Visit | Attending: Family Medicine | Admitting: Family Medicine

## 2018-03-15 DIAGNOSIS — Z1239 Encounter for other screening for malignant neoplasm of breast: Secondary | ICD-10-CM

## 2018-03-15 DIAGNOSIS — Z1231 Encounter for screening mammogram for malignant neoplasm of breast: Secondary | ICD-10-CM | POA: Diagnosis not present

## 2018-03-22 ENCOUNTER — Encounter: Payer: Self-pay | Admitting: General Surgery

## 2018-03-22 ENCOUNTER — Ambulatory Visit (INDEPENDENT_AMBULATORY_CARE_PROVIDER_SITE_OTHER): Payer: Medicare HMO | Admitting: General Surgery

## 2018-03-22 VITALS — BP 130/74 | HR 72 | Resp 14 | Ht 64.0 in | Wt 146.0 lb

## 2018-03-22 DIAGNOSIS — N6012 Diffuse cystic mastopathy of left breast: Secondary | ICD-10-CM

## 2018-03-22 DIAGNOSIS — Z1231 Encounter for screening mammogram for malignant neoplasm of breast: Secondary | ICD-10-CM

## 2018-03-22 MED ORDER — POLYETHYLENE GLYCOL 3350 17 GM/SCOOP PO POWD: 1.0000 | Freq: Once | ORAL | 0 refills | Status: AC

## 2018-03-22 NOTE — Progress Notes (Signed)
Patient ID: Kayla Wade, female   DOB: 18-Feb-1951, 67 y.o.   MRN: [id number]  Chief Complaint  Patient presents with  . Follow-up    HPI Kayla Wade is a 67 y.o. female who presents for a breast evaluation. The most recent mammogram was done on 03/15/2018.  Patient does perform regular self breast checks and gets regular mammograms done.   Daughter is [employer] .  HPI  Past Medical History:  Diagnosis Date  . Acid reflux   . High cholesterol     Past Surgical History:  Procedure Laterality Date  . ABDOMINAL HYSTERECTOMY    . BRAIN SURGERY    . BREAST BIOPSY Left Jan 22 2013   BENIGN BREAST TISSUE WITH FIBROCYSTIC CHANGES INCLUDING USUAL  . BREAST SURGERY Right 1998   right breast biopsy  . CHOLECYSTECTOMY    . COLONOSCOPY  2006   [facility], Andris Flurry, [place]  . ELBOW SURGERY    . TONSILLECTOMY      Family History  Problem Relation Age of Onset  . Lung cancer Sister     Social History Social History   Tobacco Use  . Smoking status: Never Smoker  . Smokeless tobacco: Never Used  Substance Use Topics  . Alcohol use: No  . Drug use: Not on file    Allergies  Allergen Reactions  . Other     Tussinex-nausea  . Erythromycin Base Rash    Current Outpatient Medications  Medication Sig Dispense Refill  . aspirin 81 MG chewable tablet Chew by mouth.    Marland Kitchen atorvastatin (LIPITOR) 20 MG tablet Take 20 mg by mouth daily.    . enalapril (VASOTEC) 2.5 MG tablet Take by mouth.    . potassium chloride (K-DUR) 10 MEQ tablet Take by mouth.    . propranolol (INDERAL) 20 MG tablet Take 20 mg by mouth 3 (three) times daily.    . risperiDONE (RISPERDAL) 2 MG tablet Take 2 mg by mouth at bedtime.    . trihexyphenidyl (ARTANE) 5 MG tablet Take 5 mg by mouth 2 (two) times daily with a meal.    . metFORMIN (GLUCOPHAGE-XR) 500 MG 24 hr tablet Take by mouth.     No current facility-administered medications for this visit.     Review of Systems Review of Systems   Constitutional: Negative.   Respiratory: Negative.   Cardiovascular: Negative.     Blood pressure 130/74, pulse 72, resp. rate 14, height 5\' 4"  (1.626 m), weight 146 lb (66.2 kg).  Physical Exam Physical Exam  Constitutional: She is oriented to person, place, and time. She appears well-developed and well-nourished.  Eyes: Conjunctivae are normal. No scleral icterus.  Neck: Neck supple.  Cardiovascular: Normal rate, regular rhythm and normal heart sounds.  Pulmonary/Chest: Effort normal and breath sounds normal. Right breast exhibits no inverted nipple, no mass, no nipple discharge, no skin change and no tenderness. Left breast exhibits no inverted nipple, no mass, no nipple discharge, no skin change and no tenderness.  Lymphadenopathy:    She has no cervical adenopathy.    She has no axillary adenopathy.  Neurological: She is alert and oriented to person, place, and time.  Skin: Skin is warm and dry.    Data Reviewed March 16, 2018 bilateral screening mammograms were reviewed.  No interval change.  BI-RADS-1.  Most recent colonoscopy in the epic system was 2006.   Assessment    Benign breast exam.    Candidate for screening colonoscopy.  Opportunity to  be referred back to the [place] clinic discussed.  Patient amenable to having the procedure completed through this office.    Plan  The patient should have annual screening mammograms and clinical exams with her PCP.    Colonoscopy with possible biopsy/polypectomy prn: Information regarding the procedure, including its potential risks and complications (including but not limited to perforation of the bowel, which may require emergency surgery to repair, and bleeding) was verbally given to the patient. Educational information regarding lower intestinal endoscopy was given to the patient. Written instructions for how to complete the bowel prep using Miralax were provided. The importance of drinking ample fluids to avoid dehydration  as a result of the prep emphasized.  HPI, Physical Exam, Assessment and Plan have been scribed under the direction and in the presence of Hervey Ard, MD. Gaspar Cola, CMA   I have completed the exam and reviewed the above documentation for accuracy and completeness.  I agree with the above.  Haematologist has been used and any errors in dictation or transcription are unintentional.  Hervey Ard, M.D., F.A.C.S. The patient is scheduled for a Colonoscopy at [facility] on 04/11/18. They are aware to call the day before to get their arrival time. She will hold her Metformin the day of prep and procedure. She will only take her Propranolol the morning of at 6 am with a sip of water. Miralax prescription has been sent into the patient's pharmacy. The patient is aware of date and instructions.  Documented by Caryl-Lyn Otis Brace LPN    Forest Gleason Mariha Sleeper 03/23/2018, 4:49 PM

## 2018-03-22 NOTE — Patient Instructions (Addendum)
Patient will be asked to return in one year with a bilateral screening mammogram. The patient is aw Colonoscopy, Adult A colonoscopy is an exam to look at the entire large intestine. During the exam, a lubricated, bendable tube is inserted into the anus and then passed into the rectum, colon, and other parts of the large intestine. A colonoscopy is often done as a part of normal colorectal screening or in response to certain symptoms, such as anemia, persistent diarrhea, abdominal pain, and blood in the stool. The exam can help screen for and diagnose medical problems, including:  Tumors.  Polyps.  Inflammation.  Areas of bleeding.  Tell a health care provider about:  Any allergies you have.  All medicines you are taking, including vitamins, herbs, eye drops, creams, and over-the-counter medicines.  Any problems you or family members have had with anesthetic medicines.  Any blood disorders you have.  Any surgeries you have had.  Any medical conditions you have.  Any problems you have had passing stool. What are the risks? Generally, this is a safe procedure. However, problems may occur, including:  Bleeding.  A tear in the intestine.  A reaction to medicines given during the exam.  Infection (rare).  What happens before the procedure? Eating and drinking restrictions Follow instructions from your health care provider about eating and drinking, which may include:  A few days before the procedure - follow a low-fiber diet. Avoid nuts, seeds, dried fruit, raw fruits, and vegetables.  1-3 days before the procedure - follow a clear liquid diet. Drink only clear liquids, such as clear broth or bouillon, black coffee or tea, clear juice, clear soft drinks or sports drinks, gelatin dessert, and popsicles. Avoid any liquids that contain red or purple dye.  On the day of the procedure - do not eat or drink anything during the 2 hours before the procedure, or within the time  period that your health care provider recommends.  Bowel prep If you were prescribed an oral bowel prep to clean out your colon:  Take it as told by your health care provider. Starting the day before your procedure, you will need to drink a large amount of medicated liquid. The liquid will cause you to have multiple loose stools until your stool is almost clear or light green.  If your skin or anus gets irritated from diarrhea, you may use these to relieve the irritation: ? Medicated wipes, such as adult wet wipes with aloe and vitamin E. ? A skin soothing-product like petroleum jelly.  If you vomit while drinking the bowel prep, take a break for up to 60 minutes and then begin the bowel prep again. If vomiting continues and you cannot take the bowel prep without vomiting, call your health care provider.  General instructions  Ask your health care provider about changing or stopping your regular medicines. This is especially important if you are taking diabetes medicines or blood thinners.  Plan to have someone take you home from the hospital or clinic. What happens during the procedure?  An IV tube may be inserted into one of your veins.  You will be given medicine to help you relax (sedative).  To reduce your risk of infection: ? Your health care team will wash or sanitize their hands. ? Your anal area will be washed with soap.  You will be asked to lie on your side with your knees bent.  Your health care provider will lubricate a long, thin, flexible tube. The tube  will have a camera and a light on the end.  The tube will be inserted into your anus.  The tube will be gently eased through your rectum and colon.  Air will be delivered into your colon to keep it open. You may feel some pressure or cramping.  The camera will be used to take images during the procedure.  A small tissue sample may be removed from your body to be examined under a microscope (biopsy). If any  potential problems are found, the tissue will be sent to a lab for testing.  If small polyps are found, your health care provider may remove them and have them checked for cancer cells.  The tube that was inserted into your anus will be slowly removed. The procedure may vary among health care providers and hospitals. What happens after the procedure?  Your blood pressure, heart rate, breathing rate, and blood oxygen level will be monitored until the medicines you were given have worn off.  Do not drive for 24 hours after the exam.  You may have a small amount of blood in your stool.  You may pass gas and have mild abdominal cramping or bloating due to the air that was used to inflate your colon during the exam.  It is up to you to get the results of your procedure. Ask your health care provider, or the department performing the procedure, when your results will be ready. This information is not intended to replace advice given to you by your health care provider. Make sure you discuss any questions you have with your health care provider. Document Released: 08/19/2000 Document Revised: 06/22/2016 Document Reviewed: 11/03/2015 Elsevier Interactive Patient Education  [employer]. are to call back for any questions or concerns.  The patient is scheduled for a Colonoscopy at [facility] on 04/11/18. They are aware to call the day before to get their arrival time. She will hold her Metformin the day of prep and procedure. She will only take her Propranolol the morning of at 6 am with a sip of water. Miralax prescription has been sent into the patient's pharmacy. The patient is aware of date and instructions.

## 2018-03-23 ENCOUNTER — Encounter: Payer: Self-pay | Admitting: General Surgery

## 2018-04-11 ENCOUNTER — Ambulatory Visit: Payer: Medicare HMO | Admitting: Anesthesiology

## 2018-04-11 ENCOUNTER — Encounter
Admission: RE | Disposition: A | Discharge status: Home / Self Care | Payer: Self-pay | Source: Ambulatory Visit | Attending: General Surgery

## 2018-04-11 ENCOUNTER — Ambulatory Visit
Admission: RE | Admit: 2018-04-11 | Discharge: 2018-04-11 | Disposition: A | Discharge status: Home / Self Care | Payer: Medicare HMO | Source: Ambulatory Visit | Attending: General Surgery | Admitting: General Surgery

## 2018-04-11 DIAGNOSIS — Z7982 Long term (current) use of aspirin: Secondary | ICD-10-CM | POA: Insufficient documentation

## 2018-04-11 DIAGNOSIS — K6389 Other specified diseases of intestine: Secondary | ICD-10-CM | POA: Diagnosis not present

## 2018-04-11 DIAGNOSIS — K219 Gastro-esophageal reflux disease without esophagitis: Secondary | ICD-10-CM | POA: Insufficient documentation

## 2018-04-11 DIAGNOSIS — Z1211 Encounter for screening for malignant neoplasm of colon: Secondary | ICD-10-CM | POA: Diagnosis present

## 2018-04-11 DIAGNOSIS — I10 Essential (primary) hypertension: Secondary | ICD-10-CM | POA: Insufficient documentation

## 2018-04-11 DIAGNOSIS — F329 Major depressive disorder, single episode, unspecified: Secondary | ICD-10-CM | POA: Diagnosis not present

## 2018-04-11 DIAGNOSIS — E7800 Pure hypercholesterolemia, unspecified: Secondary | ICD-10-CM | POA: Insufficient documentation

## 2018-04-11 DIAGNOSIS — D123 Benign neoplasm of transverse colon: Secondary | ICD-10-CM

## 2018-04-11 DIAGNOSIS — Z79899 Other long term (current) drug therapy: Secondary | ICD-10-CM | POA: Diagnosis not present

## 2018-04-11 DIAGNOSIS — K624 Stenosis of anus and rectum: Secondary | ICD-10-CM | POA: Insufficient documentation

## 2018-04-11 DIAGNOSIS — Z1231 Encounter for screening mammogram for malignant neoplasm of breast: Secondary | ICD-10-CM

## 2018-04-11 HISTORY — PX: COLONOSCOPY WITH PROPOFOL: SHX5780

## 2018-04-11 LAB — GLUCOSE, CAPILLARY: GLUCOSE-CAPILLARY: 120 mg/dL — AB (ref 70–99)

## 2018-04-11 SURGERY — COLONOSCOPY WITH PROPOFOL
Anesthesia: General

## 2018-04-11 MED ORDER — SODIUM CHLORIDE 0.9 % IV SOLN
INTRAVENOUS | Status: DC
  Administered 2018-04-11: 1000 mL via INTRAVENOUS

## 2018-04-11 MED ORDER — GLYCOPYRROLATE 0.2 MG/ML IJ SOLN
INTRAMUSCULAR | Status: DC | PRN
  Administered 2018-04-11: 0.1 mg via INTRAVENOUS

## 2018-04-11 MED ORDER — PROPOFOL 500 MG/50ML IV EMUL
INTRAVENOUS | Status: DC | PRN
  Administered 2018-04-11: 100 ug/kg/min via INTRAVENOUS

## 2018-04-11 MED ORDER — LIDOCAINE HCL (PF) 2 % IJ SOLN
INTRAMUSCULAR | Status: AC
  Filled 2018-04-11: qty 10

## 2018-04-11 MED ORDER — LIDOCAINE HCL (CARDIAC) PF 100 MG/5ML IV SOSY
PREFILLED_SYRINGE | INTRAVENOUS | Status: DC | PRN
  Administered 2018-04-11: 40 mg via INTRAVENOUS

## 2018-04-11 MED ORDER — PROPOFOL 10 MG/ML IV BOLUS
INTRAVENOUS | Status: DC | PRN
  Administered 2018-04-11: 50 mg via INTRAVENOUS
  Administered 2018-04-11: 100 mg via INTRAVENOUS
  Administered 2018-04-11: 50 mg via INTRAVENOUS

## 2018-04-11 MED ORDER — PROPOFOL 500 MG/50ML IV EMUL
INTRAVENOUS | Status: AC
  Filled 2018-04-11: qty 50

## 2018-04-11 MED ORDER — GLYCOPYRROLATE 0.2 MG/ML IJ SOLN
INTRAMUSCULAR | Status: AC
  Filled 2018-04-11: qty 1

## 2018-04-11 NOTE — Addendum Note (Signed)
Addendum  created 04/11/18 1336 by Alphonsus Sias, MD   Sign clinical note

## 2018-04-11 NOTE — Transfer of Care (Signed)
Immediate Anesthesia Transfer of Care Note  Patient: Kayla Wade  Procedure(s) Performed: COLONOSCOPY WITH PROPOFOL (N/A )  Patient Location: PACU and Endoscopy Unit  Anesthesia Type:General  Level of Consciousness: drowsy and patient cooperative  Airway & Oxygen Therapy: Patient Spontanous Breathing  Post-op Assessment: Report given to RN, Post -op Vital signs reviewed and stable and Patient moving all extremities  Post vital signs: Reviewed and stable  Last Vitals:  Vitals Value Taken Time  BP 102/42 04/11/2018  8:04 AM  Temp 36.1 C 04/11/2018  8:03 AM  Pulse 60 04/11/2018  8:05 AM  Resp 18 04/11/2018  8:05 AM  SpO2 95 % 04/11/2018  8:05 AM  Vitals shown include unvalidated device data.  Last Pain:  Vitals:   04/11/18 0803  TempSrc: Tympanic  PainSc: 0-No pain         Complications: No apparent anesthesia complications

## 2018-04-11 NOTE — Anesthesia Preprocedure Evaluation (Signed)
Anesthesia Evaluation  Patient identified by MRN, date of birth, ID band Patient awake    Reviewed: Allergy & Precautions, H&P , NPO status , reviewed documented beta blocker date and time   Airway Mallampati: II  TM Distance: <3 FB     Dental  (+) Edentulous Upper, Chipped, Missing   Pulmonary    Pulmonary exam normal        Cardiovascular hypertension, Normal cardiovascular exam     Neuro/Psych  Headaches, PSYCHIATRIC DISORDERS Depression    GI/Hepatic GERD  Medicated and Controlled,  Endo/Other    Renal/GU Renal disease     Musculoskeletal   Abdominal   Peds  Hematology   Anesthesia Other Findings Past Medical History: No date: Acid reflux No date: High cholesterol  Past Surgical History: No date: ABDOMINAL HYSTERECTOMY No date: BRAIN SURGERY Jan 22 2013: BREAST BIOPSY; Left     Comment:  BENIGN BREAST TISSUE WITH FIBROCYSTIC CHANGES INCLUDING               USUAL 1998: BREAST SURGERY; Right     Comment:  right breast biopsy No date: CHOLECYSTECTOMY 2006: COLONOSCOPY     Comment:  [facility], Andris Flurry, MD No date: ELBOW SURGERY No date: TONSILLECTOMY  BMI    Body Mass Index:  25.06 kg/m      Reproductive/Obstetrics                             Anesthesia Physical Anesthesia Plan  ASA: II  Anesthesia Plan: General   Post-op Pain Management:    Induction: Intravenous  PONV Risk Score and Plan: Treatment may vary due to age or medical condition and TIVA  Airway Management Planned: Nasal Cannula and Natural Airway  Additional Equipment:   Intra-op Plan:   Post-operative Plan:   Informed Consent: I have reviewed the patients History and Physical, chart, labs and discussed the procedure including the risks, benefits and alternatives for the proposed anesthesia with the patient or authorized representative who has indicated his/her understanding and  acceptance.   Dental Advisory Given  Plan Discussed with: CRNA  Anesthesia Plan Comments:         Anesthesia Quick Evaluation

## 2018-04-11 NOTE — Progress Notes (Signed)
Dr.Fath, from cardiology, paged at [phone] and returned page at [facility]. Dr.Fath acknowledged. 12 lead EKG obtained. Awaiting orders.

## 2018-04-11 NOTE — Anesthesia Post-op Follow-up Note (Signed)
Anesthesia QCDR form completed.        

## 2018-04-11 NOTE — Anesthesia Postprocedure Evaluation (Signed)
Anesthesia Post Note  Patient: Kayla Wade  Procedure(s) Performed: COLONOSCOPY WITH PROPOFOL (N/A )  Patient location during evaluation: Endoscopy Anesthesia Type: General Level of consciousness: awake and alert, oriented and patient cooperative Pain management: satisfactory to patient Vital Signs Assessment: post-procedure vital signs reviewed and stable Respiratory status: spontaneous breathing and respiratory function stable Cardiovascular status: blood pressure returned to baseline and stable Postop Assessment: no headache, no backache, patient able to bend at knees, no apparent nausea or vomiting, adequate PO intake and able to ambulate Anesthetic complications: no     Last Vitals:  Vitals:   04/11/18 0645 04/11/18 0803  BP: (!) 143/73   Pulse: 63   Resp: 20 16  Temp: (!) 36.2 C (!) 36.1 C  SpO2: 100% 95%    Last Pain:  Vitals:   04/11/18 0803  TempSrc: Tympanic  PainSc: 0-No pain                 Nemiah Kissner H Anthonymichael Munday

## 2018-04-11 NOTE — Op Note (Signed)
[facility]enter[facility] Patient Name: Kayla Wade Procedure Date: 04/11/2018 7:25 AM MRN: [id number] Account #: [id number] Date of Birth: [birth year] Admit Type: Outpatient Age: 67 Room: [facility] ENDO ROOM 1 Gender: Female Note Status: Finalized Procedure:            Colonoscopy Indications:          Screening for colorectal malignant neoplasm Providers:            Robert Bellow, MD Referring MD:         Gayland Curry MD, MD (Referring MD) Medicines:            Monitored Anesthesia Care Complications:        No immediate complications. Procedure:            Pre-Anesthesia Assessment:                       - Prior to the procedure, a History and Physical was                        performed, and patient medications, allergies and                        sensitivities were reviewed. The patient's tolerance of                        previous anesthesia was reviewed.                       - The risks and benefits of the procedure and the                        sedation options and risks were discussed with the                        patient. All questions were answered and informed                        consent was obtained.                       After obtaining informed consent, the colonoscope was                        passed under direct vision. Throughout the procedure,                        the patient's blood pressure, pulse, and oxygen                        saturations were monitored continuously. The                        Colonoscope was introduced through the anus and                        advanced to the the cecum, identified by appendiceal                        orifice and ileocecal valve. The colonoscopy was  somewhat difficult due to significant looping.                        Successful completion of the procedure was aided by                        using manual pressure. The patient tolerated the   procedure well. The quality of the bowel preparation                        was excellent. Findings:      Two sessile polyps were found in the transverse colon, mid transverse       colon and hepatic flexure. The polyps were 5 to 8 mm in size. These were       biopsied with a cold forceps for histology.      The mucosa vascular pattern in the descending colon, in the transverse       colon and in the ascending colon was diffusely increased.      A benign-appearing, intrinsic mild stenosis measuring 1 cm (in length) x       1 cm (inner diameter) was found in the mid rectum and was traversed.       Biopsies were taken with a cold forceps for histology.      The retroflexed view of the distal rectum and anal verge was normal and       showed no anal or rectal abnormalities. Impression:           - Two 5 to 8 mm polyps in the transverse colon, in the                        mid transverse colon and at the hepatic flexure.                        Biopsied.                       - Increased mucosa vascular pattern in the descending                        colon, in the transverse colon and in the ascending                        colon.                       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 Biopsied.                       - The distal rectum and anal verge are normal on                        retroflexion view. Recommendation:       - Telephone endoscopist for pathology results in 1 week. Procedure Code(s):    --- Professional ---                       [phone], Colonoscopy, flexible; with biopsy, single or                        multiple Diagnosis Code(s):    --- Professional ---  K62.4, Stenosis of anus and rectum                       D12.3, Benign neoplasm of transverse colon (hepatic                        flexure or splenic flexure)                       Z12.11, Encounter for screening for malignant neoplasm                        of colon CPT copyright 2017 American  Medical Association. All rights reserved. The codes documented in this report are preliminary and upon coder review may  be revised to meet current compliance requirements. Robert Bellow, MD 04/11/2018 8:03:52 AM This report has been signed electronically. Number of Addenda: 0 Note Initiated On: 04/11/2018 7:25 AM Scope Withdrawal Time: 0 hours 13 minutes 35 seconds  Total Procedure Duration: 0 hours 22 minutes 24 seconds       [facility]

## 2018-04-11 NOTE — H&P (Signed)
Kayla Wade [id number] [birth year]     HPI: 67 y/o female for screening colonoscopy. Tolerated prep well.   Medications Prior to Admission  Medication Sig Dispense Refill Last Dose  . aspirin 81 MG chewable tablet Chew by mouth.   04/10/2018 at Unknown time  . atorvastatin (LIPITOR) 20 MG tablet Take 20 mg by mouth daily.   04/10/2018 at Unknown time  . enalapril (VASOTEC) 2.5 MG tablet Take by mouth.   04/10/2018 at Unknown time  . potassium chloride (K-DUR) 10 MEQ tablet Take by mouth.   Past Week at Unknown time  . propranolol (INDERAL) 20 MG tablet Take 20 mg by mouth 3 (three) times daily.   04/11/2018 at 0600  . risperiDONE (RISPERDAL) 2 MG tablet Take 2 mg by mouth at bedtime.   Past Week at Unknown time  . trihexyphenidyl (ARTANE) 5 MG tablet Take 5 mg by mouth 2 (two) times daily with a meal.   Past Week at Unknown time  . metFORMIN (GLUCOPHAGE-XR) 500 MG 24 hr tablet Take by mouth.      Allergies  Allergen Reactions  . Other     Tussinex-nausea  . Erythromycin Base Rash   Past Medical History:  Diagnosis Date  . Acid reflux   . High cholesterol    Past Surgical History:  Procedure Laterality Date  . ABDOMINAL HYSTERECTOMY    . BRAIN SURGERY    . BREAST BIOPSY Left Jan 22 2013   BENIGN BREAST TISSUE WITH FIBROCYSTIC CHANGES INCLUDING USUAL  . BREAST SURGERY Right 1998   right breast biopsy  . CHOLECYSTECTOMY    . COLONOSCOPY  2006   [facility], Andris Flurry, [place]  . ELBOW SURGERY    . TONSILLECTOMY     Social History   Socioeconomic History  . Marital status: Married    Spouse name: Not on file  . Number of children: Not on file  . Years of education: Not on file  . Highest education level: Not on file  Occupational History  . Not on file  Social Needs  . Financial resource strain: Not on file  . Food insecurity:    Worry: Not on file    Inability: Not on file  . Transportation needs:    Medical: Not on file    Non-medical: Not on file  Tobacco Use  .  Smoking status: Never Smoker  . Smokeless tobacco: Never Used  Substance and Sexual Activity  . Alcohol use: No  . Drug use: Not on file  . Sexual activity: Not on file  Lifestyle  . Physical activity:    Days per week: Not on file    Minutes per session: Not on file  . Stress: Not on file  Relationships  . Social connections:    Talks on phone: Not on file    Gets together: Not on file    Attends religious service: Not on file    Active member of club or organization: Not on file    Attends meetings of clubs or organizations: Not on file    Relationship status: Not on file  . Intimate partner violence:    Fear of current or ex partner: Not on file    Emotionally abused: Not on file    Physically abused: Not on file    Forced sexual activity: Not on file  Other Topics Concern  . Not on file  Social History Narrative  . Not on file   Social History   Social  History Narrative  . Not on file     ROS: Negative.     PE: HEENT: Negative. Lungs: Clear. Cardio: RR.  Assessment/Plan:  Proceed with planned endoscopy.  Forest Gleason [facility] 04/11/2018

## 2018-04-11 NOTE — Anesthesia Postprocedure Evaluation (Signed)
Anesthesia Post Note  Patient: Kayla Wade  Procedure(s) Performed: COLONOSCOPY WITH PROPOFOL (N/A )  Patient location during evaluation: Endoscopy Anesthesia Type: General Level of consciousness: awake and alert Pain management: pain level controlled Vital Signs Assessment: post-procedure vital signs reviewed and stable Respiratory status: spontaneous breathing, nonlabored ventilation and respiratory function stable Cardiovascular status: blood pressure returned to baseline and stable Postop Assessment: no apparent nausea or vomiting Anesthetic complications: no Comments: Pt in Bi-geminy post op. Pt VS stable, no complaints. Cardiology eval done, pt will F/U w Cardiology as OP     Last Vitals:  Vitals:   04/11/18 0813 04/11/18 0818  BP: (!) 103/48 (!) 121/56  Pulse:    Resp:    Temp:    SpO2:      Last Pain:  Vitals:   04/11/18 0906  TempSrc:   PainSc: 0-No pain                 Alphonsus Sias

## 2018-04-13 ENCOUNTER — Telehealth: Payer: Self-pay | Admitting: General Surgery

## 2018-04-13 LAB — SURGICAL PATHOLOGY

## 2018-04-13 NOTE — Telephone Encounter (Signed)
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  She reports tolerating the procedure well.

## 2018-04-24 ENCOUNTER — Other Ambulatory Visit: Payer: Self-pay | Admitting: Family Medicine

## 2018-04-24 DIAGNOSIS — Z780 Asymptomatic menopausal state: Secondary | ICD-10-CM

## 2019-03-25 ENCOUNTER — Encounter: Payer: Self-pay | Admitting: General Surgery

## 2019-05-15 ENCOUNTER — Other Ambulatory Visit: Payer: Self-pay | Admitting: Family Medicine

## 2019-05-15 DIAGNOSIS — Z1231 Encounter for screening mammogram for malignant neoplasm of breast: Secondary | ICD-10-CM

## 2019-06-19 ENCOUNTER — Ambulatory Visit
Admission: RE | Admit: 2019-06-19 | Discharge: 2019-06-19 | Disposition: A | Discharge status: Home / Self Care | Payer: Medicare Other | Source: Ambulatory Visit | Attending: Family Medicine | Admitting: Family Medicine

## 2019-06-19 DIAGNOSIS — Z1231 Encounter for screening mammogram for malignant neoplasm of breast: Secondary | ICD-10-CM

## 2019-06-24 ENCOUNTER — Other Ambulatory Visit: Payer: Self-pay | Admitting: Family Medicine

## 2019-06-24 DIAGNOSIS — R928 Other abnormal and inconclusive findings on diagnostic imaging of breast: Secondary | ICD-10-CM

## 2019-06-24 DIAGNOSIS — N6320 Unspecified lump in the left breast, unspecified quadrant: Secondary | ICD-10-CM

## 2019-07-04 ENCOUNTER — Ambulatory Visit
Admission: RE | Admit: 2019-07-04 | Discharge: 2019-07-04 | Disposition: A | Discharge status: Home / Self Care | Payer: Medicare Other | Source: Ambulatory Visit | Attending: Family Medicine | Admitting: Family Medicine

## 2019-07-04 DIAGNOSIS — R928 Other abnormal and inconclusive findings on diagnostic imaging of breast: Secondary | ICD-10-CM | POA: Diagnosis not present

## 2019-07-04 DIAGNOSIS — N6320 Unspecified lump in the left breast, unspecified quadrant: Secondary | ICD-10-CM

## 2020-02-05 ENCOUNTER — Other Ambulatory Visit: Payer: Self-pay | Admitting: Family Medicine

## 2020-02-05 DIAGNOSIS — Z780 Asymptomatic menopausal state: Secondary | ICD-10-CM

## 2020-02-27 IMAGING — MG MM DIGITAL SCREENING BILAT W/ TOMO W/ CAD
6 of 10 series · 6 of 30 positions shown · non-contrast
Comparison: Previous exam(s).

CLINICAL DATA: Screening.

EXAM:
DIGITAL SCREENING BILATERAL MAMMOGRAM WITH TOMO AND CAD

[L CC synth-2D]
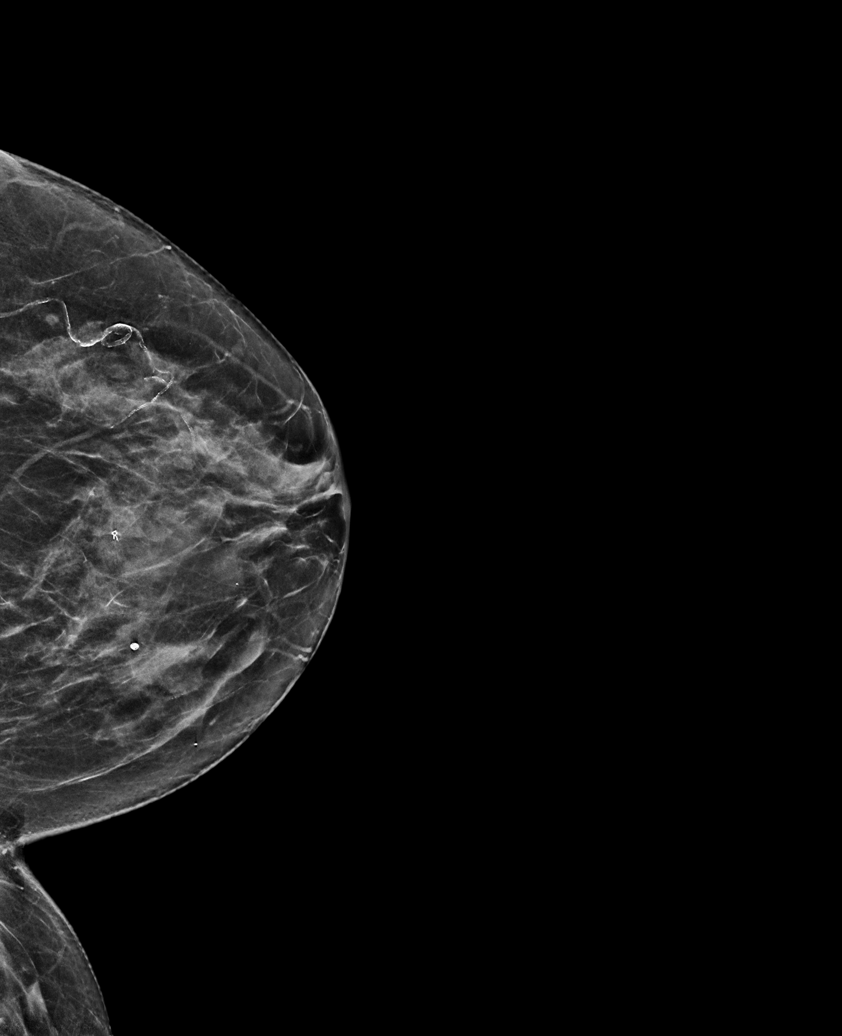

[R MLO synth-2D]
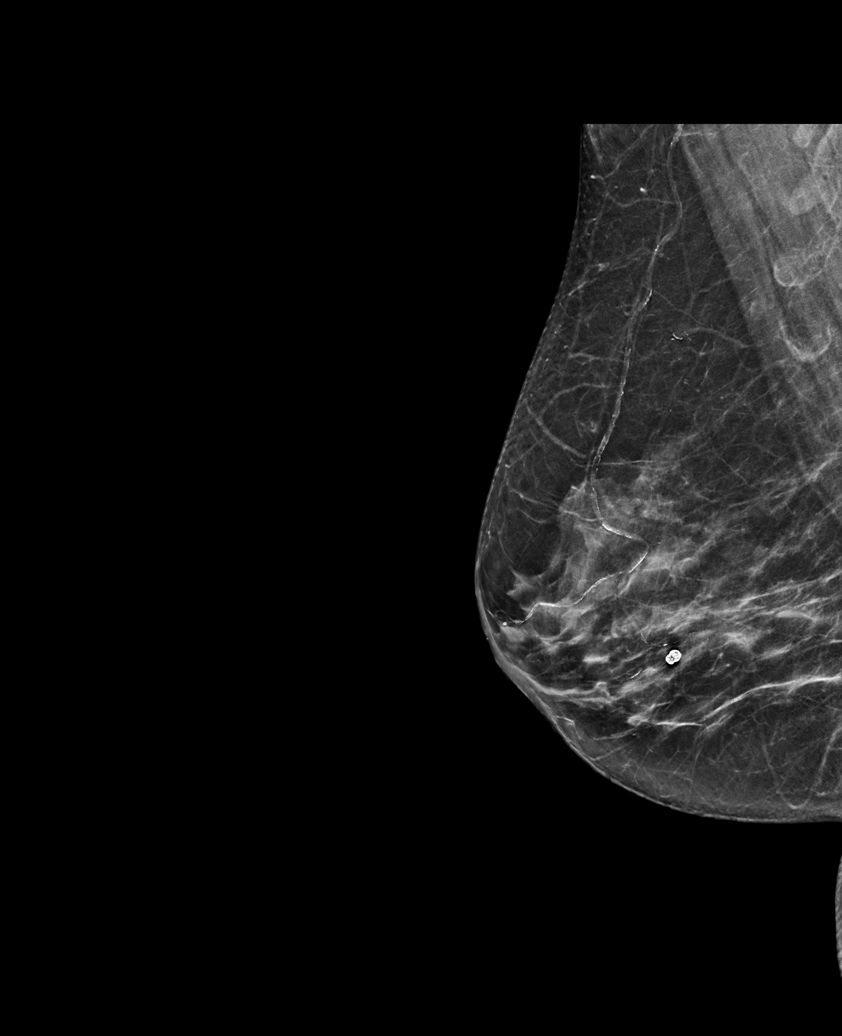

[L MLO synth-2D (1 of 2)]
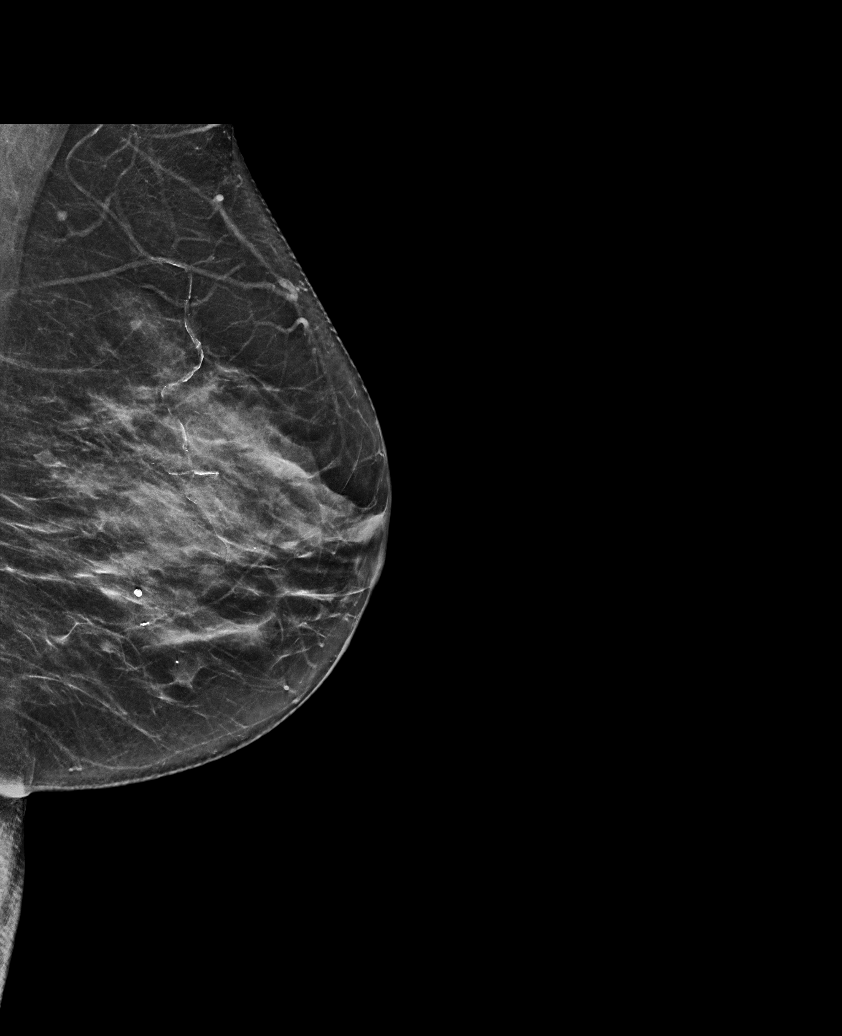

[L MLO synth-2D (2 of 2)]
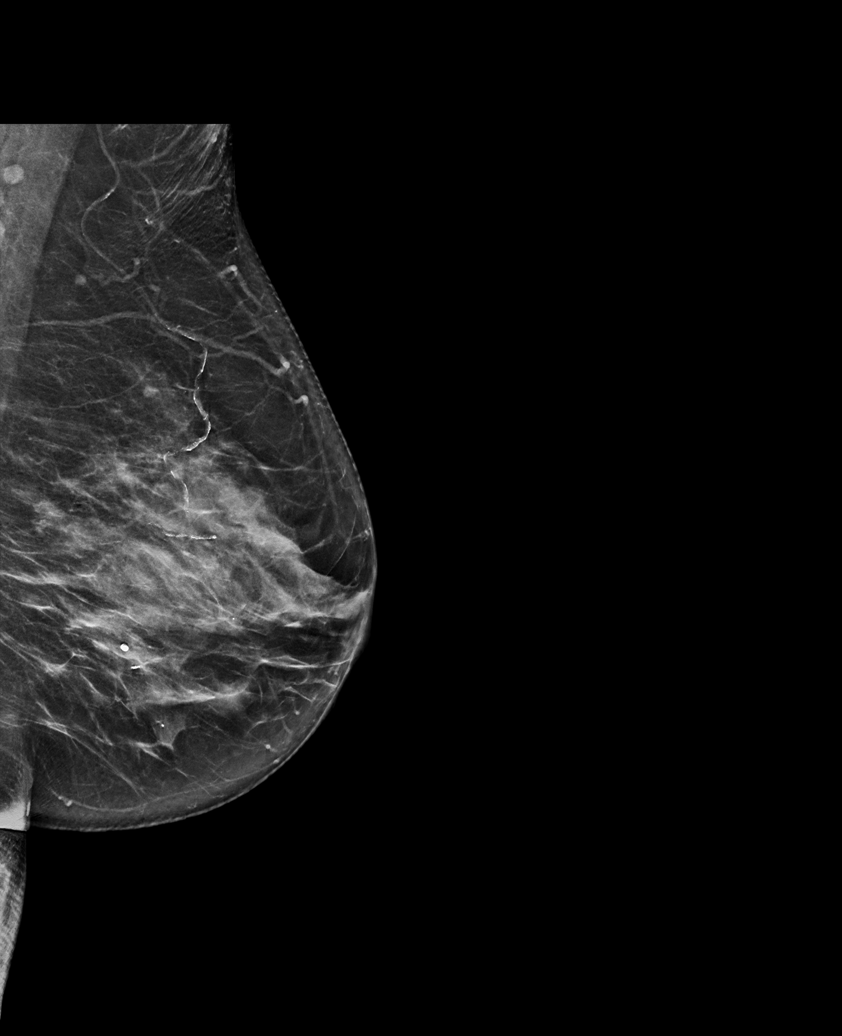

[R CC synth-2D]
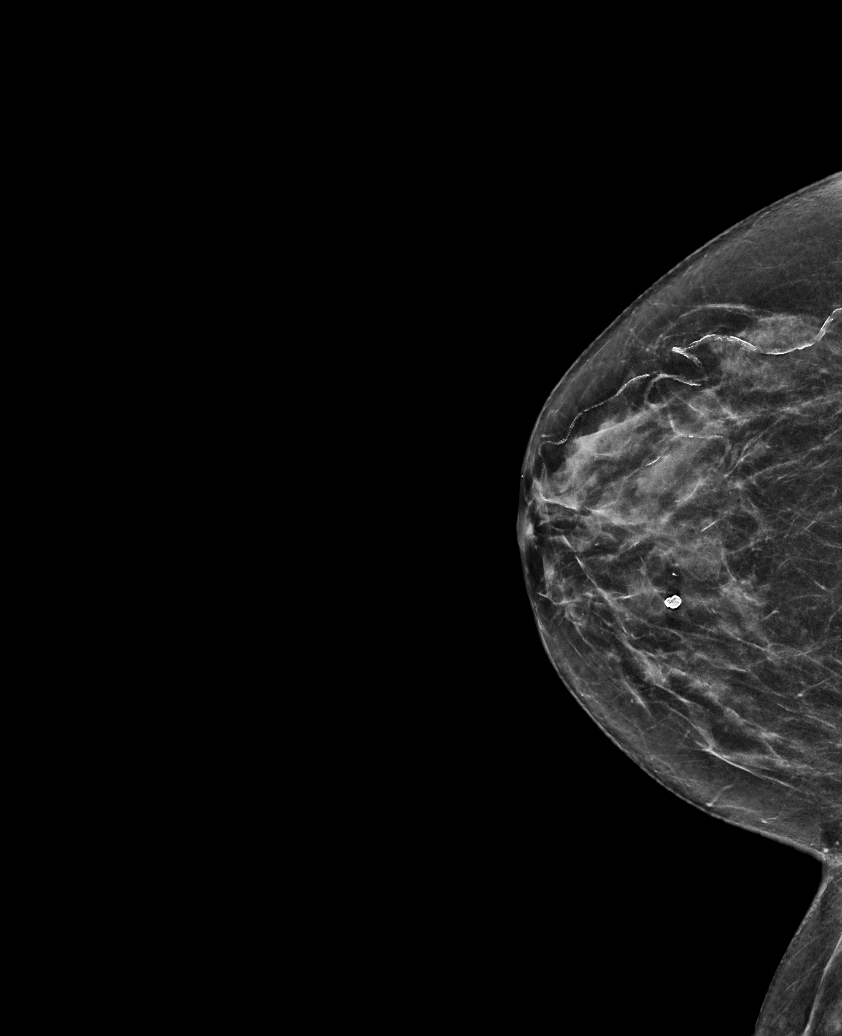

[L CC tomo · tomo slice 30/59.0]
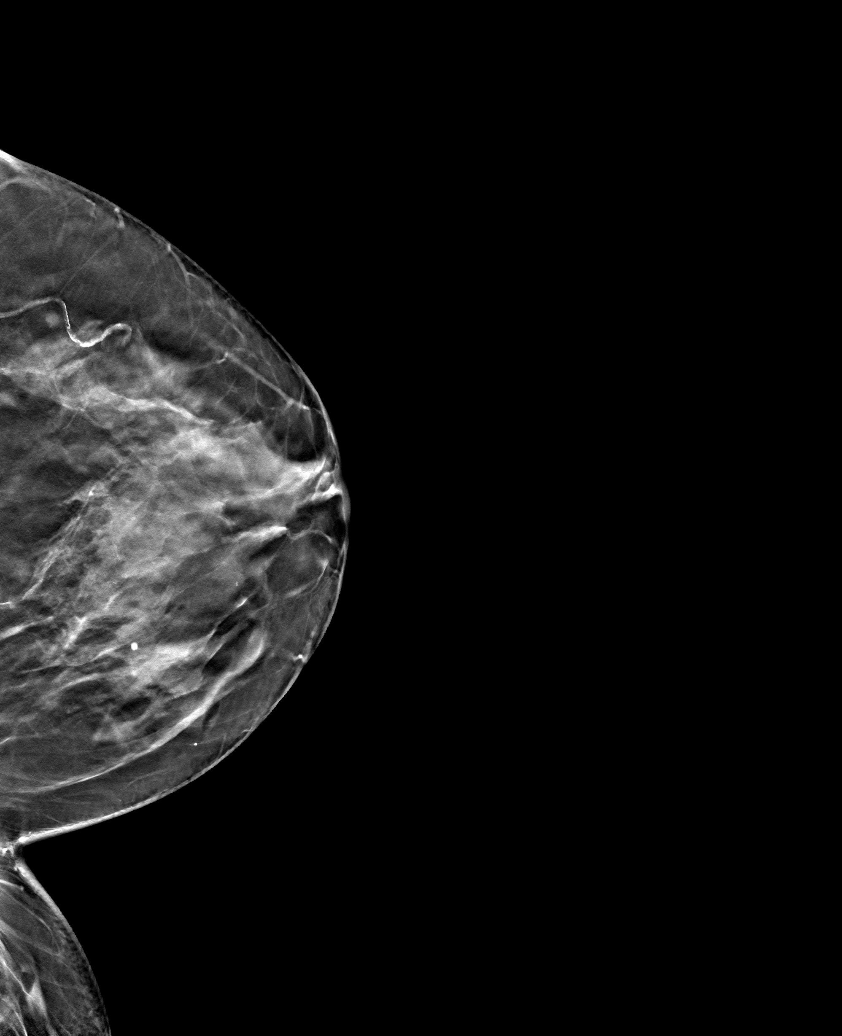

[6 of 30 positions shown; findings below may reference images not displayed]

ACR Breast Density Category c: The breast tissue is heterogeneously
dense, which may obscure small masses.
FINDINGS: There are no findings suspicious for malignancy. Images were
processed with CAD.
IMPRESSION: No mammographic evidence of malignancy. A result letter of this
screening mammogram will be mailed directly to the patient.

RECOMMENDATION:
Screening mammogram in one year. (Code:[id number])

BI-RADS CATEGORY  1: Negative.

## 2020-07-17 ENCOUNTER — Other Ambulatory Visit: Payer: Self-pay | Admitting: Family Medicine

## 2020-07-17 DIAGNOSIS — Z1231 Encounter for screening mammogram for malignant neoplasm of breast: Secondary | ICD-10-CM

## 2020-08-25 ENCOUNTER — Ambulatory Visit
Admission: RE | Admit: 2020-08-25 | Discharge: 2020-08-25 | Disposition: A | Discharge status: Home / Self Care | Payer: Medicare Other | Source: Ambulatory Visit | Attending: Family Medicine | Admitting: Family Medicine

## 2020-08-25 ENCOUNTER — Other Ambulatory Visit: Payer: Self-pay

## 2020-08-25 DIAGNOSIS — Z1231 Encounter for screening mammogram for malignant neoplasm of breast: Secondary | ICD-10-CM | POA: Diagnosis not present

## 2020-09-16 ENCOUNTER — Ambulatory Visit
Admission: RE | Admit: 2020-09-16 | Discharge: 2020-09-16 | Disposition: A | Discharge status: Home / Self Care | Payer: Medicare Other | Attending: Family Medicine | Admitting: Family Medicine

## 2020-09-16 ENCOUNTER — Other Ambulatory Visit: Payer: Self-pay | Admitting: Family Medicine

## 2020-09-16 ENCOUNTER — Ambulatory Visit
Admission: RE | Admit: 2020-09-16 | Discharge: 2020-09-16 | Disposition: A | Discharge status: Home / Self Care | Payer: Medicare Other | Source: Ambulatory Visit | Attending: Family Medicine | Admitting: Family Medicine

## 2020-09-16 DIAGNOSIS — R52 Pain, unspecified: Secondary | ICD-10-CM | POA: Insufficient documentation

## 2021-02-19 ENCOUNTER — Other Ambulatory Visit: Payer: Self-pay | Admitting: Family Medicine

## 2021-02-19 DIAGNOSIS — Z780 Asymptomatic menopausal state: Secondary | ICD-10-CM

## 2021-03-07 DIAGNOSIS — Z9889 Other specified postprocedural states: Secondary | ICD-10-CM | POA: Insufficient documentation

## 2021-05-01 ENCOUNTER — Encounter: Payer: Self-pay | Admitting: *Deleted

## 2021-05-01 ENCOUNTER — Emergency Department: Payer: Medicare Other

## 2021-05-01 ENCOUNTER — Emergency Department
Admission: EM | Admit: 2021-05-01 | Discharge: 2021-05-01 | Disposition: A | Discharge status: Home / Self Care | Payer: Medicare Other | Attending: Emergency Medicine | Admitting: Emergency Medicine

## 2021-05-01 ENCOUNTER — Other Ambulatory Visit: Payer: Self-pay

## 2021-05-01 DIAGNOSIS — S80212A Abrasion, left knee, initial encounter: Secondary | ICD-10-CM | POA: Diagnosis not present

## 2021-05-01 DIAGNOSIS — Z79899 Other long term (current) drug therapy: Secondary | ICD-10-CM | POA: Insufficient documentation

## 2021-05-01 DIAGNOSIS — W010XXA Fall on same level from slipping, tripping and stumbling without subsequent striking against object, initial encounter: Secondary | ICD-10-CM | POA: Diagnosis not present

## 2021-05-01 DIAGNOSIS — I10 Essential (primary) hypertension: Secondary | ICD-10-CM | POA: Insufficient documentation

## 2021-05-01 DIAGNOSIS — T07XXXA Unspecified multiple injuries, initial encounter: Secondary | ICD-10-CM

## 2021-05-01 DIAGNOSIS — S80211A Abrasion, right knee, initial encounter: Secondary | ICD-10-CM | POA: Diagnosis not present

## 2021-05-01 DIAGNOSIS — M25572 Pain in left ankle and joints of left foot: Secondary | ICD-10-CM

## 2021-05-01 DIAGNOSIS — R52 Pain, unspecified: Secondary | ICD-10-CM

## 2021-05-01 DIAGNOSIS — Z7982 Long term (current) use of aspirin: Secondary | ICD-10-CM | POA: Diagnosis not present

## 2021-05-01 MED ORDER — MUPIROCIN CALCIUM 2 % EX CREA: 1.0000 "application " | TOPICAL_CREAM | Freq: Two times a day (BID) | CUTANEOUS | 0 refills | Status: AC

## 2021-05-01 MED ORDER — HYDROCODONE-ACETAMINOPHEN 5-325 MG PO TABS
1.0000 | ORAL_TABLET | Freq: Once | ORAL | Status: AC
Start: 2021-05-01 — End: 2021-05-01
  Administered 2021-05-01: 1 via ORAL
  Filled 2021-05-01: qty 1

## 2021-05-01 MED ORDER — ONDANSETRON 4 MG PO TBDP
4.0000 mg | ORAL_TABLET | Freq: Once | ORAL | Status: AC
  Administered 2021-05-01: 4 mg via ORAL
  Filled 2021-05-01: qty 1

## 2021-05-01 MED ORDER — ONDANSETRON 4 MG PO TBDP: 4.0000 mg | ORAL_TABLET | Freq: Three times a day (TID) | ORAL | 0 refills | Status: AC | PRN

## 2021-05-01 MED ORDER — HYDROCODONE-ACETAMINOPHEN 5-325 MG PO TABS: 1.0000 | ORAL_TABLET | Freq: Four times a day (QID) | ORAL | 0 refills | Status: AC | PRN

## 2021-05-01 NOTE — ED Triage Notes (Signed)
Pt says that she was walking down some steps and got a "catch" in her left groin, causing her to fall. She has pain in the left ankle, abrasion to the right knee, left knee and left foot.

## 2021-05-01 NOTE — ED Notes (Addendum)
Pt c/o left ankle pain post fall, states the foot is numb.  Pt can move left ankle/foot, capillary refill <3 seconds.

## 2021-05-01 NOTE — ED Provider Notes (Signed)
[facility]-EMERGENCY DEPARTMENT  ____________________________________________  Time seen: Approximately 11:29 PM  I have reviewed the triage vital signs and the nursing notes.   HISTORY  Chief Complaint Fall   Historian Patient     HPI Kayla Wade is a 70 y.o. female presents to the emergency department with left lateral ankle pain after a fall.  Patient states that she tripped while taking out the trash and sustained an inversion type ankle injury.  She denies hitting her head or her neck.  Patient reports that she has been unable to ambulate since injury occurred.  Patient has several superficial abrasions along the bilateral knees and the left ankle.   Past Medical History:  Diagnosis Date   Acid reflux    High cholesterol      Immunizations up to date:  Yes.     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   HYPERLIPIDEMIA 02/01/2007   COMMON MIGRAINE 02/01/2007   HYPERTENSION 02/01/2007   EDEMA 02/01/2007    Past Surgical History:  Procedure Laterality Date   ABDOMINAL HYSTERECTOMY     BRAIN SURGERY     BREAST BIOPSY Left Jan 22 2013   BENIGN BREAST TISSUE WITH FIBROCYSTIC CHANGES INCLUDING USUAL   BREAST SURGERY Right 1998   right breast biopsy   CHOLECYSTECTOMY     COLONOSCOPY  2006   [place], [place]   COLONOSCOPY WITH PROPOFOL N/A 04/11/2018   Procedure: COLONOSCOPY WITH PROPOFOL;  Surgeon: Robert Bellow, MD;  Location: [facility];  Service: Endoscopy;  Laterality: N/A;   ELBOW SURGERY     TONSILLECTOMY      Prior to Admission medications    Medication Sig Start Date End Date Taking? Authorizing Provider  HYDROcodone-acetaminophen (NORCO) 5-325 MG tablet Take 1 tablet by mouth every 6 (six) hours as needed for up to 3 days. 05/01/21 05/04/21 Yes Vallarie Mare M, PA-C  mupirocin cream (BACTROBAN) 2 % Apply 1 application topically 2 (two) times daily for 7 days. Apply twice daily for seven days. 05/01/21 05/08/21 Yes Vallarie Mare M, PA-C  ondansetron (ZOFRAN ODT) 4 MG disintegrating tablet Take 1 tablet (4 mg total) by mouth every 8 (eight) hours as needed for up to 4 days. 05/01/21 05/05/21 Yes Vallarie Mare M, PA-C  aspirin 81 MG chewable tablet Chew by mouth.    [provider]  atorvastatin (LIPITOR) 20 MG tablet Take 20 mg by mouth daily.    [provider]  enalapril (VASOTEC) 2.5 MG tablet Take by mouth. 04/19/17 04/19/18  [provider]  metFORMIN (GLUCOPHAGE-XR) 500 MG 24 hr tablet Take by mouth. 10/09/15 10/08/16  [provider]  potassium chloride (K-DUR) 10 MEQ tablet Take by mouth. 01/08/18   [provider]  propranolol (INDERAL) 20 MG tablet Take 20 mg by mouth 3 (three) times daily.    [provider]  risperiDONE (RISPERDAL) 2 MG tablet Take 2 mg by mouth at bedtime.    [provider]  trihexyphenidyl (ARTANE) 5 MG tablet Take 5 mg by mouth 2 (two) times daily with a meal.  [provider]    Allergies Other and Erythromycin base  Family History  Problem Relation Age of Onset   Lung cancer Sister    Breast cancer Neg Hx     Social History Social History   Tobacco Use   Smoking status: Never   Smokeless tobacco: Never  Substance Use Topics   Alcohol use: No     Review of Systems  Constitutional: No fever/chills Eyes:  No discharge ENT: No upper respiratory complaints. Respiratory: no cough. No SOB/ use of accessory muscles to breath Gastrointestinal:   No nausea, no vomiting.  No diarrhea.  No constipation. Musculoskeletal: Patient has  left ankle pain.  Skin: Negative for rash, abrasions, lacerations, ecchymosis.    ____________________________________________   PHYSICAL EXAM:  VITAL SIGNS: ED Triage Vitals [05/01/21 2137]  Enc Vitals Group     BP 132/73     Pulse Rate 86     Resp 16     Temp 98.6 F (37 C)     Temp Source Oral     SpO2 98 %     Weight 155 lb (70.3 kg)     Height '5\' 4"'$  (1.626 m)     Head Circumference      Peak Flow      Pain Score 3     Pain Loc      Pain Edu?      Excl. in [place]?      Constitutional: Alert and oriented. Well appearing and in no acute distress. Eyes: Conjunctivae are normal. PERRL. EOMI. Head: Atraumatic. ENT: Cardiovascular: Normal rate, regular rhythm. Normal S1 and S2.  Good peripheral circulation. Respiratory: Normal respiratory effort without tachypnea or retractions. Lungs CTAB. Good air entry to the bases with no decreased or absent breath sounds Gastrointestinal: Bowel sounds x 4 quadrants. Soft and nontender to palpation. No guarding or rigidity. No distention. Musculoskeletal: Patient performs limited range of motion at the left ankle, likely due to pain.  She is able to move all 5 left toes.  Palpable dorsalis pedis pulse bilaterally and symmetrically.  Capillary refill less than 2 seconds on the left.  Patient has tenderness to palpation of the anterior and posterior talofibular ligaments.  No pain over the deltoid ligament. Neurologic:  Normal for age. No gross focal neurologic deficits are appreciated.  Skin: Patient has multiple abrasions of the lower extremities that are superficial in nature. Psychiatric: Mood and affect are normal for age. Speech and behavior are normal.   ____________________________________________   LABS (all labs ordered are listed, but only abnormal results are displayed)  Labs Reviewed - No data to display ____________________________________________  EKG   ____________________________________________  RADIOLOGY Unk Pinto, personally viewed and evaluated these images (plain radiographs) as part of my medical decision making, as well as reviewing the written report by the radiologist.  DG Ankle Complete Left  Result Date: 05/01/2021 CLINICAL DATA:  Left ankle and foot pain.  Status post fall. EXAM: LEFT FOOT - COMPLETE 3+ VIEW; LEFT ANKLE COMPLETE - 3+ VIEW COMPARISON:  None. FINDINGS: Left foot: There is no evidence of fracture or dislocation. There is no evidence of arthropathy or other focal bone abnormality. Soft tissues are unremarkable. Left ankle: No evidence of fracture, dislocation, or joint effusion. No evidence of severe arthropathy. No aggressive appearing focal bone abnormality. Soft tissues are unremarkable. IMPRESSION: No acute displaced fracture or dislocation of the left foot and ankle. Electronically Signed   By: Clelia Croft.D.  On: 05/01/2021 22:14   DG Foot Complete Left  Result Date: 05/01/2021 CLINICAL DATA:  Left ankle and foot pain.  Status post fall. EXAM: LEFT FOOT - COMPLETE 3+ VIEW; LEFT ANKLE COMPLETE - 3+ VIEW COMPARISON:  None. FINDINGS: Left foot: There is no evidence of fracture or dislocation. There is no evidence of arthropathy or other focal bone abnormality. Soft tissues are unremarkable. Left ankle: No evidence of fracture, dislocation, or joint effusion. No evidence of severe arthropathy. No aggressive appearing focal bone abnormality. Soft tissues are unremarkable. IMPRESSION: No acute displaced fracture or dislocation of the left foot and ankle. Electronically Signed   By: Iven Finn M.D.   On: 05/01/2021 22:14    ____________________________________________    PROCEDURES  Procedure(s) performed:     Procedures     Medications  HYDROcodone-acetaminophen (NORCO/VICODIN) 5-325 MG per tablet 1 tablet (1 tablet Oral Given 05/01/21 2323)  ondansetron (ZOFRAN-ODT) disintegrating tablet 4 mg (4 mg Oral Given 05/01/21 2323)      ____________________________________________   INITIAL IMPRESSION / ASSESSMENT AND PLAN / ED COURSE  Pertinent labs & imaging results that were available during my care of the patient were reviewed by me and considered in my medical decision making (see chart for details).      Assessment and plan Ankle pain 70 year old female presents to the emergency department with acute left ankle pain after an inversion type ankle injury.  No bony abnormality of the left ankle and left foot.  An Ace wrap was applied and crutches were provided.  I also offered patient a walker the patient stated that she would rather have crutches.  She was advised to follow-up with podiatrist, Dr. Fara Olden per patient's request.  Lebron Quam was prescribed for pain as well as topical mupirocin for abrasions.  All patient questions were answered.     ____________________________________________  FINAL CLINICAL IMPRESSION(S) / ED DIAGNOSES  Final diagnoses:  Pain  Acute left ankle pain  Abrasions of multiple sites      NEW MEDICATIONS STARTED DURING THIS VISIT:  ED Discharge Orders          Ordered    mupirocin cream (BACTROBAN) 2 %  2 times daily        05/01/21 2303    HYDROcodone-acetaminophen (NORCO) 5-325 MG tablet  Every 6 hours PRN        05/01/21 2303    ondansetron (ZOFRAN ODT) 4 MG disintegrating tablet  Every 8 hours PRN        05/01/21 2303                This chart was dictated using voice recognition software/Dragon. Despite best efforts to proofread, errors can occur which can change the meaning. Any change was purely unintentional.     Lannie Fields, PA-C 05/01/21 2332    Arta Silence, MD 05/01/21 2352

## 2021-05-01 NOTE — Discharge Instructions (Addendum)
You can take Norco for pain. Please wear Ace wrap for compression. If symptoms do not improve in 2 to 3 weeks, please follow-up with podiatry.

## 2021-05-01 NOTE — ED Triage Notes (Signed)
FIRST NURSE NOTE:   Pt arrived via [facility] after fall in daughters driveway, c/o L ankle pain, no head injury VSS  BP 153/76 p-94 97%

## 2021-05-05 ENCOUNTER — Other Ambulatory Visit: Payer: Self-pay

## 2021-05-05 ENCOUNTER — Ambulatory Visit
Admission: RE | Admit: 2021-05-05 | Discharge: 2021-05-05 | Disposition: A | Discharge status: Home / Self Care | Payer: Medicare Other | Source: Ambulatory Visit | Attending: Family Medicine | Admitting: Family Medicine

## 2021-05-05 DIAGNOSIS — Z780 Asymptomatic menopausal state: Secondary | ICD-10-CM | POA: Diagnosis present

## 2021-06-15 ENCOUNTER — Other Ambulatory Visit: Payer: Self-pay | Admitting: Orthopedic Surgery

## 2021-06-18 ENCOUNTER — Other Ambulatory Visit: Payer: Self-pay

## 2021-06-18 ENCOUNTER — Other Ambulatory Visit
Admission: RE | Admit: 2021-06-18 | Discharge: 2021-06-18 | Disposition: A | Discharge status: Home / Self Care | Payer: Medicare Other | Source: Ambulatory Visit | Attending: Orthopedic Surgery | Admitting: Orthopedic Surgery

## 2021-06-18 DIAGNOSIS — Z01818 Encounter for other preprocedural examination: Secondary | ICD-10-CM | POA: Diagnosis not present

## 2021-06-18 HISTORY — DX: Type 2 diabetes mellitus without complications: E11.9

## 2021-06-18 HISTORY — DX: Abnormal level of blood mineral: R79.0

## 2021-06-18 HISTORY — DX: Ventricular premature depolarization: I49.3

## 2021-06-18 HISTORY — DX: Atrial premature depolarization: I49.1

## 2021-06-18 HISTORY — DX: Bipolar disorder, unspecified: F31.9

## 2021-06-18 HISTORY — DX: Bradycardia, unspecified: R00.1

## 2021-06-18 HISTORY — DX: Personal history of urinary calculi: Z87.442

## 2021-06-18 HISTORY — DX: Essential (primary) hypertension: I10

## 2021-06-18 HISTORY — DX: Nausea with vomiting, unspecified: R11.2

## 2021-06-18 HISTORY — DX: Unspecified osteoarthritis, unspecified site: M19.90

## 2021-06-18 HISTORY — DX: Other specified postprocedural states: Z98.890

## 2021-06-18 LAB — URINALYSIS, ROUTINE W REFLEX MICROSCOPIC
Bilirubin Urine: NEGATIVE
Glucose, UA: NEGATIVE mg/dL
Hgb urine dipstick: NEGATIVE
Ketones, ur: NEGATIVE mg/dL
Leukocytes,Ua: NEGATIVE
Nitrite: NEGATIVE
Protein, ur: NEGATIVE mg/dL
Specific Gravity, Urine: 1.008 (ref 1.005–1.030)
pH: 7 (ref 5.0–8.0)

## 2021-06-18 LAB — CBC WITH DIFFERENTIAL/PLATELET
Abs Immature Granulocytes: 0.04 10*3/uL (ref 0.00–0.07)
Basophils Absolute: 0.1 10*3/uL (ref 0.0–0.1)
Basophils Relative: 1 %
Eosinophils Absolute: 0.1 10*3/uL (ref 0.0–0.5)
Eosinophils Relative: 1 %
HCT: 37.5 % (ref 36.0–46.0)
Hemoglobin: 12.7 g/dL (ref 12.0–15.0)
Immature Granulocytes: 0 %
Lymphocytes Relative: 30 %
Lymphs Abs: 2.7 10*3/uL (ref 0.7–4.0)
MCH: 30.3 pg (ref 26.0–34.0)
MCHC: 33.9 g/dL (ref 30.0–36.0)
MCV: 89.5 fL (ref 80.0–100.0)
Monocytes Absolute: 0.8 10*3/uL (ref 0.1–1.0)
Monocytes Relative: 9 %
Neutro Abs: 5.2 10*3/uL (ref 1.7–7.7)
Neutrophils Relative %: 59 %
Platelets: 345 10*3/uL (ref 150–400)
RBC: 4.19 MIL/uL (ref 3.87–5.11)
RDW: 11.4 % — ABNORMAL LOW (ref 11.5–15.5)
WBC: 9 10*3/uL (ref 4.0–10.5)
nRBC: 0 % (ref 0.0–0.2)

## 2021-06-18 LAB — COMPREHENSIVE METABOLIC PANEL
ALT: 83 U/L — ABNORMAL HIGH (ref 0–44)
AST: 64 U/L — ABNORMAL HIGH (ref 15–41)
Albumin: 4.1 g/dL (ref 3.5–5.0)
Alkaline Phosphatase: 83 U/L (ref 38–126)
Anion gap: 11 (ref 5–15)
BUN: 20 mg/dL (ref 8–23)
CO2: 27 mmol/L (ref 22–32)
Calcium: 10.3 mg/dL (ref 8.9–10.3)
Chloride: 98 mmol/L (ref 98–111)
Creatinine, Ser: 1.06 mg/dL — ABNORMAL HIGH (ref 0.44–1.00)
GFR, Estimated: 57 mL/min — ABNORMAL LOW (ref >60)
Glucose, Bld: 114 mg/dL — ABNORMAL HIGH (ref 70–99)
Potassium: 4.5 mmol/L (ref 3.5–5.1)
Sodium: 136 mmol/L (ref 135–145)
Total Bilirubin: 0.8 mg/dL (ref 0.3–1.2)
Total Protein: 8 g/dL (ref 6.5–8.1)

## 2021-06-18 LAB — SURGICAL PCR SCREEN
MRSA, PCR: NEGATIVE
Staphylococcus aureus: POSITIVE — AB

## 2021-06-18 LAB — TYPE AND SCREEN
ABO/RH(D): O POS
Antibody Screen: NEGATIVE

## 2021-06-18 NOTE — Patient Instructions (Addendum)
Your procedure is scheduled on:06-24-21 Thursday Report to the Registration Desk on the 1st floor of the [facility].Then proceed to the 2nd floor Surgery Desk in the [facility] To find out your arrival time, please call [phone] between 1PM - 3PM on:06-23-21 Wednesday  REMEMBER: Instructions that are not followed completely may result in serious medical risk, up to and including death; or upon the discretion of your surgeon and anesthesiologist your surgery may need to be rescheduled.  Do not eat food after midnight the night before surgery.  No gum chewing, lozengers or hard candies.  You may however, drink Water up to 2 hours before you are scheduled to arrive for your surgery. Do not drink anything within 2 hours of your scheduled arrival time.  Type 1 and Type 2 diabetics should only drink water.  TAKE THESE MEDICATIONS THE MORNING OF SURGERY WITH A SIP OF WATER: -famotidine (PEPCID) 20 MG tablet (take one the night before and one on the morning of surgery - helps to prevent nausea after surgery.)  Stop metFORMIN (GLUCOPHAGE-XR) 500 MG 24 hr tablet 2 days prior to surgery-Last dose on 06-21-21 Monday  One week prior to surgery: Stop Anti-inflammatories (NSAIDS) such as Advil, Aleve, Ibuprofen, Motrin, Naproxen, Naprosyn and Aspirin based products such as Excedrin, Goodys Powder, BC Powder.You may however, continue to take Tylenol/Tramadol if needed for pain up until the day of surgery.  Stop ANY OVER THE COUNTER supplements/vitamins NOW (06-18-21) until after surgery (Vitamin B12 and multivitamin)  No Alcohol for 24 hours before or after surgery.  No Smoking including e-cigarettes for 24 hours prior to surgery.  No chewable tobacco products for at least 6 hours prior to surgery.  No nicotine patches on the day of surgery.  Do not use any "recreational" drugs for at least a week prior to your surgery.  Please be advised that the combination of cocaine and anesthesia may  have negative outcomes, up to and including death. If you test positive for cocaine, your surgery will be cancelled.  On the morning of surgery brush your teeth with toothpaste and water, you may rinse your mouth with mouthwash if you wish. Do not swallow any toothpaste or mouthwash.  Use CHG Soap as directed on instruction sheet.  Do not wear jewelry, make-up, hairpins, clips or nail polish.  Do not wear lotions, powders, or perfumes.   Do not shave body from the neck down 48 hours prior to surgery just in case you cut yourself which could leave a site for infection.  Also, freshly shaved skin may become irritated if using the CHG soap.  Contact lenses, hearing aids and dentures may not be worn into surgery.  Do not bring valuables to the hospital. [facility] is not responsible for any missing/lost belongings or valuables.   Notify your doctor if there is any change in your medical condition (cold, fever, infection).  Wear comfortable clothing (specific to your surgery type) to the hospital.  After surgery, you can help prevent lung complications by doing breathing exercises.  Take deep breaths and cough every 1-2 hours. Your doctor may order a device called an Incentive Spirometer to help you take deep breaths. When coughing or sneezing, hold a pillow firmly against your incision with both hands. This is called "splinting." Doing this helps protect your incision. It also decreases belly discomfort.  If you are being admitted to the hospital overnight, leave your suitcase in the car. After surgery it may be brought to your room.  If you are being discharged the day of surgery, you will not be allowed to drive home. You will need a responsible adult (18 years or older) to drive you home and stay with you that night.   If you are taking public transportation, you will need to have a responsible adult (18 years or older) with you. Please confirm with your physician that it is  acceptable to use public transportation.   Please call the [facility] at [phone] if you have any questions about these instructions.  Surgery Visitation Policy:  Patients undergoing a surgery or procedure may have one family member or support person with them as long as that person is not COVID-19 positive or experiencing its symptoms.  That person may remain in the waiting area during the procedure and may rotate out with other people.  Inpatient Visitation:    Visiting hours are 7 a.m. to 8 p.m. Up to two visitors ages 16+ are allowed at one time in a patient room. The visitors may rotate out with other people during the day. Visitors must check out when they leave, or other visitors will not be allowed. One designated support person may remain overnight. The visitor must pass COVID-19 screenings, use hand sanitizer when entering and exiting the patient's room and wear a mask at all times, including in the patient's room. Patients must also wear a mask when staff or their visitor are in the room. Masking is required regardless of vaccination status.

## 2021-06-21 ENCOUNTER — Other Ambulatory Visit: Payer: Self-pay

## 2021-06-21 ENCOUNTER — Other Ambulatory Visit
Admission: RE | Admit: 2021-06-21 | Discharge: 2021-06-21 | Disposition: A | Discharge status: Home / Self Care | Payer: Medicare Other | Source: Ambulatory Visit | Attending: Orthopedic Surgery | Admitting: Orthopedic Surgery

## 2021-06-21 DIAGNOSIS — Z20822 Contact with and (suspected) exposure to covid-19: Secondary | ICD-10-CM | POA: Diagnosis not present

## 2021-06-21 DIAGNOSIS — Z01812 Encounter for preprocedural laboratory examination: Secondary | ICD-10-CM | POA: Diagnosis present

## 2021-06-21 LAB — SARS CORONAVIRUS 2 (TAT 6-24 HRS): SARS Coronavirus 2: NEGATIVE

## 2021-06-24 ENCOUNTER — Encounter
Admission: RE | Disposition: A | Discharge status: Home / Self Care | Payer: Self-pay | Source: Home / Self Care | Attending: Orthopedic Surgery

## 2021-06-24 ENCOUNTER — Other Ambulatory Visit: Payer: Self-pay

## 2021-06-24 ENCOUNTER — Observation Stay
Admission: RE | Admit: 2021-06-24 | Discharge: 2021-06-25 | Disposition: A | Discharge status: Home / Self Care | Payer: Medicare Other | Attending: Orthopedic Surgery | Admitting: Orthopedic Surgery

## 2021-06-24 ENCOUNTER — Ambulatory Visit: Payer: Medicare Other

## 2021-06-24 ENCOUNTER — Ambulatory Visit: Payer: Medicare Other | Admitting: Urgent Care

## 2021-06-24 ENCOUNTER — Encounter: Payer: Self-pay | Admitting: Orthopedic Surgery

## 2021-06-24 ENCOUNTER — Ambulatory Visit: Payer: Medicare Other | Admitting: Anesthesiology

## 2021-06-24 ENCOUNTER — Observation Stay: Payer: Medicare Other

## 2021-06-24 DIAGNOSIS — E119 Type 2 diabetes mellitus without complications: Secondary | ICD-10-CM | POA: Diagnosis not present

## 2021-06-24 DIAGNOSIS — G8918 Other acute postprocedural pain: Secondary | ICD-10-CM

## 2021-06-24 DIAGNOSIS — Z7984 Long term (current) use of oral hypoglycemic drugs: Secondary | ICD-10-CM | POA: Diagnosis not present

## 2021-06-24 DIAGNOSIS — Z79899 Other long term (current) drug therapy: Secondary | ICD-10-CM | POA: Insufficient documentation

## 2021-06-24 DIAGNOSIS — Z419 Encounter for procedure for purposes other than remedying health state, unspecified: Secondary | ICD-10-CM

## 2021-06-24 DIAGNOSIS — I10 Essential (primary) hypertension: Secondary | ICD-10-CM | POA: Diagnosis not present

## 2021-06-24 DIAGNOSIS — M1612 Unilateral primary osteoarthritis, left hip: Principal | ICD-10-CM | POA: Insufficient documentation

## 2021-06-24 DIAGNOSIS — Z96642 Presence of left artificial hip joint: Secondary | ICD-10-CM

## 2021-06-24 HISTORY — PX: TOTAL HIP ARTHROPLASTY: SHX124

## 2021-06-24 LAB — CBC
HCT: 29.3 % — ABNORMAL LOW (ref 36.0–46.0)
Hemoglobin: 10.1 g/dL — ABNORMAL LOW (ref 12.0–15.0)
MCH: 30.6 pg (ref 26.0–34.0)
MCHC: 34.5 g/dL (ref 30.0–36.0)
MCV: 88.8 fL (ref 80.0–100.0)
Platelets: 270 10*3/uL (ref 150–400)
RBC: 3.3 MIL/uL — ABNORMAL LOW (ref 3.87–5.11)
RDW: 11.4 % — ABNORMAL LOW (ref 11.5–15.5)
WBC: 14.8 10*3/uL — ABNORMAL HIGH (ref 4.0–10.5)
nRBC: 0 % (ref 0.0–0.2)

## 2021-06-24 LAB — GLUCOSE, CAPILLARY
Glucose-Capillary: 152 mg/dL — ABNORMAL HIGH (ref 70–99)
Glucose-Capillary: 153 mg/dL — ABNORMAL HIGH (ref 70–99)
Glucose-Capillary: 174 mg/dL — ABNORMAL HIGH (ref 70–99)

## 2021-06-24 LAB — CREATININE, SERUM
Creatinine, Ser: 0.95 mg/dL (ref 0.44–1.00)
GFR, Estimated: >60 mL/min (ref >60)

## 2021-06-24 LAB — ABO/RH: ABO/RH(D): O POS

## 2021-06-24 SURGERY — ARTHROPLASTY, HIP, TOTAL, ANTERIOR APPROACH
Anesthesia: Spinal | Site: Hip | Laterality: Left

## 2021-06-24 MED ORDER — METHOCARBAMOL 1000 MG/10ML IJ SOLN
500.0000 mg | Freq: Four times a day (QID) | INTRAVENOUS | Status: DC | PRN
  Filled 2021-06-24: qty 5

## 2021-06-24 MED ORDER — METHOCARBAMOL 500 MG PO TABS
500.0000 mg | ORAL_TABLET | Freq: Four times a day (QID) | ORAL | Status: DC | PRN
  Administered 2021-06-24 – 2021-06-25 (×2): 500 mg via ORAL
  Filled 2021-06-24 (×2): qty 1

## 2021-06-24 MED ORDER — SODIUM CHLORIDE 0.9 % IV SOLN: INTRAVENOUS | Status: DC

## 2021-06-24 MED ORDER — CEFAZOLIN SODIUM-DEXTROSE 2-4 GM/100ML-% IV SOLN
INTRAVENOUS | Status: AC
  Filled 2021-06-24: qty 100

## 2021-06-24 MED ORDER — MIDAZOLAM HCL 2 MG/2ML IJ SOLN
INTRAMUSCULAR | Status: AC
  Filled 2021-06-24: qty 2

## 2021-06-24 MED ORDER — CEFAZOLIN SODIUM-DEXTROSE 2-4 GM/100ML-% IV SOLN
2.0000 g | INTRAVENOUS | Status: AC
  Administered 2021-06-24: 2 g via INTRAVENOUS

## 2021-06-24 MED ORDER — SODIUM CHLORIDE (PF) 0.9 % IJ SOLN
INTRAMUSCULAR | Status: DC | PRN
  Administered 2021-06-24: 90 mL

## 2021-06-24 MED ORDER — CHLORHEXIDINE GLUCONATE 0.12 % MT SOLN
OROMUCOSAL | Status: AC
  Administered 2021-06-24: 15 mL via OROMUCOSAL
  Filled 2021-06-24: qty 15

## 2021-06-24 MED ORDER — DOCUSATE SODIUM 100 MG PO CAPS
100.0000 mg | ORAL_CAPSULE | Freq: Two times a day (BID) | ORAL | Status: DC
  Administered 2021-06-24 – 2021-06-25 (×3): 100 mg via ORAL
  Filled 2021-06-24 (×3): qty 1

## 2021-06-24 MED ORDER — MENTHOL 3 MG MT LOZG
1.0000 | LOZENGE | OROMUCOSAL | Status: DC | PRN
  Filled 2021-06-24: qty 9

## 2021-06-24 MED ORDER — BUPIVACAINE LIPOSOME 1.3 % IJ SUSP
INTRAMUSCULAR | Status: AC
  Filled 2021-06-24: qty 20

## 2021-06-24 MED ORDER — FAMOTIDINE 20 MG PO TABS: 20.0000 mg | ORAL_TABLET | Freq: Every day | ORAL | Status: DC | PRN

## 2021-06-24 MED ORDER — LIDOCAINE HCL (PF) 2 % IJ SOLN
INTRAMUSCULAR | Status: AC
  Filled 2021-06-24: qty 5

## 2021-06-24 MED ORDER — NEOMYCIN-POLYMYXIN B GU 40-200000 IR SOLN
Status: AC
  Filled 2021-06-24: qty 4

## 2021-06-24 MED ORDER — PHENYLEPHRINE HCL (PRESSORS) 10 MG/ML IV SOLN
INTRAVENOUS | Status: AC
  Filled 2021-06-24: qty 1

## 2021-06-24 MED ORDER — FENTANYL CITRATE (PF) 100 MCG/2ML IJ SOLN: 25.0000 ug | INTRAMUSCULAR | Status: DC | PRN

## 2021-06-24 MED ORDER — PROPOFOL 10 MG/ML IV BOLUS
INTRAVENOUS | Status: AC
  Filled 2021-06-24: qty 20

## 2021-06-24 MED ORDER — LIDOCAINE HCL (PF) 1 % IJ SOLN
INTRAMUSCULAR | Status: AC
  Filled 2021-06-24: qty 30

## 2021-06-24 MED ORDER — SODIUM CHLORIDE FLUSH 0.9 % IV SOLN
INTRAVENOUS | Status: AC
  Filled 2021-06-24: qty 40

## 2021-06-24 MED ORDER — SCOPOLAMINE 1 MG/3DAYS TD PT72
1.0000 | MEDICATED_PATCH | TRANSDERMAL | Status: DC
  Administered 2021-06-24: 1.5 mg via TRANSDERMAL
  Filled 2021-06-24: qty 1

## 2021-06-24 MED ORDER — TRAMADOL HCL 50 MG PO TABS
50.0000 mg | ORAL_TABLET | Freq: Four times a day (QID) | ORAL | Status: DC
  Administered 2021-06-24 – 2021-06-25 (×3): 50 mg via ORAL
  Filled 2021-06-24 (×3): qty 1

## 2021-06-24 MED ORDER — PROPOFOL 500 MG/50ML IV EMUL
INTRAVENOUS | Status: DC | PRN
  Administered 2021-06-24: 40 mg via INTRAVENOUS
  Administered 2021-06-24: 20 mg via INTRAVENOUS
  Administered 2021-06-24: 30 mg via INTRAVENOUS
  Administered 2021-06-24: 75 ug/kg/min via INTRAVENOUS
  Administered 2021-06-24: 30 mg via INTRAVENOUS

## 2021-06-24 MED ORDER — INSULIN ASPART 100 UNIT/ML IJ SOLN
0.0000 [IU] | Freq: Three times a day (TID) | INTRAMUSCULAR | Status: DC
  Administered 2021-06-24: 3 [IU] via SUBCUTANEOUS
  Filled 2021-06-24: qty 1

## 2021-06-24 MED ORDER — ONDANSETRON HCL 4 MG/2ML IJ SOLN
4.0000 mg | Freq: Four times a day (QID) | INTRAMUSCULAR | Status: DC | PRN
  Administered 2021-06-24: 4 mg via INTRAVENOUS
  Filled 2021-06-24: qty 2

## 2021-06-24 MED ORDER — ONDANSETRON HCL 4 MG PO TABS
4.0000 mg | ORAL_TABLET | Freq: Four times a day (QID) | ORAL | Status: DC | PRN
  Administered 2021-06-25: 4 mg via ORAL
  Filled 2021-06-24: qty 1

## 2021-06-24 MED ORDER — PROPOFOL 1000 MG/100ML IV EMUL
INTRAVENOUS | Status: AC
  Filled 2021-06-24: qty 100

## 2021-06-24 MED ORDER — HYDROMORPHONE HCL 1 MG/ML IJ SOLN
0.5000 mg | INTRAMUSCULAR | Status: DC | PRN
  Administered 2021-06-24: 1 mg via INTRAVENOUS
  Filled 2021-06-24: qty 1

## 2021-06-24 MED ORDER — METOCLOPRAMIDE HCL 5 MG/ML IJ SOLN
5.0000 mg | Freq: Four times a day (QID) | INTRAMUSCULAR | Status: DC | PRN
  Administered 2021-06-24: 5 mg via INTRAVENOUS
  Filled 2021-06-24: qty 2

## 2021-06-24 MED ORDER — ORAL CARE MOUTH RINSE: 15.0000 mL | Freq: Once | OROMUCOSAL | Status: AC

## 2021-06-24 MED ORDER — BUPIVACAINE HCL (PF) 0.5 % IJ SOLN
INTRAMUSCULAR | Status: DC | PRN
  Administered 2021-06-24: 2.5 mL

## 2021-06-24 MED ORDER — SODIUM CHLORIDE 0.9 % IR SOLN
Status: DC | PRN
  Administered 2021-06-24: 1000 mL

## 2021-06-24 MED ORDER — BISACODYL 10 MG RE SUPP
10.0000 mg | Freq: Every day | RECTAL | Status: DC | PRN
  Filled 2021-06-24: qty 1

## 2021-06-24 MED ORDER — SPIRONOLACTONE 25 MG PO TABS
25.0000 mg | ORAL_TABLET | Freq: Every day | ORAL | Status: DC
  Administered 2021-06-24 – 2021-06-25 (×2): 25 mg via ORAL
  Filled 2021-06-24 (×2): qty 1

## 2021-06-24 MED ORDER — ONDANSETRON HCL 4 MG/2ML IJ SOLN: 4.0000 mg | Freq: Once | INTRAMUSCULAR | Status: DC | PRN

## 2021-06-24 MED ORDER — MIDAZOLAM HCL 5 MG/5ML IJ SOLN
INTRAMUSCULAR | Status: DC | PRN
  Administered 2021-06-24 (×2): 1 mg via INTRAVENOUS

## 2021-06-24 MED ORDER — MAGNESIUM CHLORIDE 64 MG PO TBEC
1.0000 | DELAYED_RELEASE_TABLET | Freq: Every day | ORAL | Status: DC
  Administered 2021-06-25: 64 mg via ORAL
  Filled 2021-06-24: qty 1

## 2021-06-24 MED ORDER — ADULT MULTIVITAMIN W/MINERALS CH
1.0000 | ORAL_TABLET | Freq: Every day | ORAL | Status: DC
  Administered 2021-06-24 – 2021-06-25 (×2): 1 via ORAL
  Filled 2021-06-24 (×2): qty 1

## 2021-06-24 MED ORDER — DIPHENHYDRAMINE HCL 12.5 MG/5ML PO ELIX
12.5000 mg | ORAL_SOLUTION | ORAL | Status: DC | PRN
  Filled 2021-06-24: qty 10

## 2021-06-24 MED ORDER — BUPIVACAINE-EPINEPHRINE (PF) 0.25% -1:200000 IJ SOLN
INTRAMUSCULAR | Status: AC
  Filled 2021-06-24: qty 30

## 2021-06-24 MED ORDER — FLEET ENEMA 7-19 GM/118ML RE ENEM: 1.0000 | ENEMA | Freq: Once | RECTAL | Status: DC | PRN

## 2021-06-24 MED ORDER — ENOXAPARIN SODIUM 40 MG/0.4ML IJ SOSY
40.0000 mg | PREFILLED_SYRINGE | INTRAMUSCULAR | Status: DC
  Administered 2021-06-25: 40 mg via SUBCUTANEOUS
  Filled 2021-06-24: qty 0.4

## 2021-06-24 MED ORDER — OXYCODONE HCL 5 MG PO TABS
10.0000 mg | ORAL_TABLET | ORAL | Status: DC | PRN
  Administered 2021-06-24: 10 mg via ORAL
  Administered 2021-06-25: 15 mg via ORAL
  Filled 2021-06-24: qty 2
  Filled 2021-06-24: qty 3

## 2021-06-24 MED ORDER — ALUM & MAG HYDROXIDE-SIMETH 200-200-20 MG/5ML PO SUSP: 30.0000 mL | ORAL | Status: DC | PRN

## 2021-06-24 MED ORDER — METFORMIN HCL ER 500 MG PO TB24
500.0000 mg | ORAL_TABLET | Freq: Two times a day (BID) | ORAL | Status: DC
  Administered 2021-06-25: 500 mg via ORAL
  Filled 2021-06-24 (×3): qty 1

## 2021-06-24 MED ORDER — CEFAZOLIN SODIUM-DEXTROSE 2-4 GM/100ML-% IV SOLN
2.0000 g | Freq: Four times a day (QID) | INTRAVENOUS | Status: AC
Start: 2021-06-24 — End: 2021-06-24
  Administered 2021-06-24 (×2): 2 g via INTRAVENOUS
  Filled 2021-06-24 (×2): qty 100

## 2021-06-24 MED ORDER — LIDOCAINE HCL 1 % IJ SOLN
INTRAMUSCULAR | Status: DC | PRN
  Administered 2021-06-24: 10 mL

## 2021-06-24 MED ORDER — ACETAMINOPHEN 325 MG PO TABS: 325.0000 mg | ORAL_TABLET | Freq: Four times a day (QID) | ORAL | Status: DC | PRN

## 2021-06-24 MED ORDER — LIDOCAINE HCL (CARDIAC) PF 100 MG/5ML IV SOSY
PREFILLED_SYRINGE | INTRAVENOUS | Status: DC | PRN
  Administered 2021-06-24: 40 mg via INTRAVENOUS

## 2021-06-24 MED ORDER — VITAMIN B-12 1000 MCG PO TABS
2000.0000 ug | ORAL_TABLET | Freq: Every day | ORAL | Status: DC
  Administered 2021-06-24 – 2021-06-25 (×2): 2000 ug via ORAL
  Filled 2021-06-24 (×2): qty 2

## 2021-06-24 MED ORDER — POLYETHYLENE GLYCOL 3350 17 G PO PACK
17.0000 g | PACK | Freq: Every day | ORAL | Status: DC | PRN
  Filled 2021-06-24: qty 1

## 2021-06-24 MED ORDER — CHLORHEXIDINE GLUCONATE 0.12 % MT SOLN: 15.0000 mL | Freq: Once | OROMUCOSAL | Status: AC

## 2021-06-24 MED ORDER — HEMOSTATIC AGENTS (NO CHARGE) OPTIME
TOPICAL | Status: DC | PRN
  Administered 2021-06-24: 2 via TOPICAL

## 2021-06-24 MED ORDER — BUPIVACAINE HCL (PF) 0.5 % IJ SOLN
INTRAMUSCULAR | Status: AC
  Filled 2021-06-24: qty 10

## 2021-06-24 MED ORDER — PHENOL 1.4 % MT LIQD
1.0000 | OROMUCOSAL | Status: DC | PRN
  Filled 2021-06-24: qty 177

## 2021-06-24 MED ORDER — ENALAPRIL MALEATE 20 MG PO TABS
20.0000 mg | ORAL_TABLET | Freq: Two times a day (BID) | ORAL | Status: DC
  Administered 2021-06-24 – 2021-06-25 (×2): 20 mg via ORAL
  Filled 2021-06-24 (×3): qty 1

## 2021-06-24 MED ORDER — OXYCODONE HCL 5 MG PO TABS
5.0000 mg | ORAL_TABLET | ORAL | Status: DC | PRN
Start: 2021-06-24 — End: 2021-06-25
  Administered 2021-06-25: 10 mg via ORAL
  Filled 2021-06-24: qty 2

## 2021-06-24 MED ORDER — MAGNESIUM OXIDE -MG SUPPLEMENT 400 (240 MG) MG PO TABS
800.0000 mg | ORAL_TABLET | Freq: Two times a day (BID) | ORAL | Status: DC
  Administered 2021-06-24 – 2021-06-25 (×2): 800 mg via ORAL
  Filled 2021-06-24 (×2): qty 2

## 2021-06-24 MED ORDER — FENTANYL CITRATE (PF) 100 MCG/2ML IJ SOLN
INTRAMUSCULAR | Status: AC
  Administered 2021-06-24: 50 ug via INTRAVENOUS
  Filled 2021-06-24: qty 2

## 2021-06-24 SURGICAL SUPPLY — 63 items
APL PRP STRL LF DISP 70% ISPRP (MISCELLANEOUS) ×1
BLADE SAGITTAL AGGR TOOTH XLG (BLADE) ×2 IMPLANT
BNDG COHESIVE 6X5 TAN ST LF (GAUZE/BANDAGES/DRESSINGS) ×6 IMPLANT
CANISTER WOUND CARE 500ML ATS (WOUND CARE) ×2 IMPLANT
CHLORAPREP W/TINT 26 (MISCELLANEOUS) ×2 IMPLANT
COVER BACK TABLE REUSABLE LG (DRAPES) ×2 IMPLANT
DRAPE 3/4 80X56 (DRAPES) ×6 IMPLANT
DRAPE C-ARM XRAY 36X54 (DRAPES) ×2 IMPLANT
DRAPE INCISE IOBAN 66X60 STRL (DRAPES) IMPLANT
DRAPE POUCH INSTRU U-SHP 10X18 (DRAPES) ×2 IMPLANT
DRESSING SURGICEL FIBRLLR 1X2 (HEMOSTASIS) ×2 IMPLANT
DRSG MEPILEX SACRM 8.7X9.8 (GAUZE/BANDAGES/DRESSINGS) ×2 IMPLANT
DRSG OPSITE POSTOP 4X8 (GAUZE/BANDAGES/DRESSINGS) ×4 IMPLANT
DRSG SURGICEL FIBRILLAR 1X2 (HEMOSTASIS) ×4
ELECT BLADE 6.5 EXT (BLADE) ×2 IMPLANT
ELECT REM PT RETURN 9FT ADLT (ELECTROSURGICAL) ×2
ELECTRODE REM PT RTRN 9FT ADLT (ELECTROSURGICAL) ×1 IMPLANT
GAUZE 4X4 16PLY ~~LOC~~+RFID DBL (SPONGE) ×2 IMPLANT
GLOVE SURG SYN 9.0  PF PI (GLOVE) ×4
GLOVE SURG SYN 9.0 PF PI (GLOVE) ×2 IMPLANT
GLOVE SURG UNDER POLY LF SZ9 (GLOVE) ×2 IMPLANT
GOWN SRG 2XL LVL 4 RGLN SLV (GOWNS) ×1 IMPLANT
GOWN STRL NON-REIN 2XL LVL4 (GOWNS) ×2
GOWN STRL REUS W/ TWL LRG LVL3 (GOWN DISPOSABLE) ×1 IMPLANT
GOWN STRL REUS W/TWL LRG LVL3 (GOWN DISPOSABLE) ×2
HEMOVAC 400CC 10FR (MISCELLANEOUS) IMPLANT
HIP FEM HD S 28 (Head) ×1 IMPLANT
HOLDER FOLEY CATH W/STRAP (MISCELLANEOUS) ×2 IMPLANT
IRRIGATION SURGIPHOR STRL (IV SOLUTION) IMPLANT
KIT PREVENA INCISION MGT 13 (CANNISTER) ×2 IMPLANT
LINER DUAL MOB 50MM (Liner) ×1 IMPLANT
MANIFOLD NEPTUNE II (INSTRUMENTS) ×2 IMPLANT
MAT ABSORB  FLUID 56X50 GRAY (MISCELLANEOUS) ×2
MAT ABSORB FLUID 56X50 GRAY (MISCELLANEOUS) ×1 IMPLANT
NDL SAFETY ECLIPSE 18X1.5 (NEEDLE) ×1 IMPLANT
NDL SPNL 20GX3.5 QUINCKE YW (NEEDLE) ×2 IMPLANT
NEEDLE HYPO 18GX1.5 SHARP (NEEDLE) ×2
NEEDLE SPNL 20GX3.5 QUINCKE YW (NEEDLE) ×4 IMPLANT
NS IRRIG 1000ML POUR BTL (IV SOLUTION) ×2 IMPLANT
PACK HIP COMPR (MISCELLANEOUS) ×2 IMPLANT
SCALPEL PROTECTED #10 DISP (BLADE) ×4 IMPLANT
SHELL ACETABULAR SZ0 50 DME (Shell) ×1 IMPLANT
SOL PREP PVP 2OZ (MISCELLANEOUS) ×2
SOLUTION PREP PVP 2OZ (MISCELLANEOUS) ×1 IMPLANT
SPONGE DRAIN TRACH 4X4 STRL 2S (GAUZE/BANDAGES/DRESSINGS) ×2 IMPLANT
SPONGE T-LAP 18X18 ~~LOC~~+RFID (SPONGE) ×4 IMPLANT
STAPLER SKIN PROX 35W (STAPLE) ×2 IMPLANT
STEM FEMORAL SZ LAT COLLARED (Stem) ×1 IMPLANT
STRAP SAFETY 5IN WIDE (MISCELLANEOUS) ×2 IMPLANT
SUT DVC 2 QUILL PDO  T11 36X36 (SUTURE) ×2
SUT DVC 2 QUILL PDO T11 36X36 (SUTURE) ×1 IMPLANT
SUT SILK 0 (SUTURE) ×2
SUT SILK 0 30XBRD TIE 6 (SUTURE) ×1 IMPLANT
SUT V-LOC 90 ABS DVC 3-0 CL (SUTURE) ×2 IMPLANT
SUT VIC AB 1 CT1 36 (SUTURE) ×2 IMPLANT
SYR 20ML LL LF (SYRINGE) ×2 IMPLANT
SYR 30ML LL (SYRINGE) ×2 IMPLANT
SYR 50ML LL SCALE MARK (SYRINGE) ×4 IMPLANT
SYR BULB IRRIG 60ML STRL (SYRINGE) ×2 IMPLANT
TAPE MICROFOAM 4IN (TAPE) ×2 IMPLANT
TOWEL OR 17X26 4PK STRL BLUE (TOWEL DISPOSABLE) ×2 IMPLANT
TRAY FOLEY MTR SLVR 16FR STAT (SET/KITS/TRAYS/PACK) ×2 IMPLANT
WATER STERILE IRR 500ML POUR (IV SOLUTION) ×2 IMPLANT

## 2021-06-24 NOTE — Op Note (Signed)
06/24/2021  11:18 AM  PATIENT:  Weston Brass  70 y.o. female  PRE-OPERATIVE DIAGNOSIS:  Primary osteoarthritis of left hip M16.12  POST-OPERATIVE DIAGNOSIS:  Primary osteoarthritis of left hip M16.12  PROCEDURE:  Procedure(s): TOTAL HIP ARTHROPLASTY ANTERIOR APPROACH (Left)  SURGEON: Laurene Footman, MD  ASSISTANTS: None  ANESTHESIA:   spinal  EBL:  Total I/O In: 1100 [I.V.:1000; IV Piggyback:100] Out: 250 [Urine:250]  BLOOD ADMINISTERED:none  DRAINS:  Incisional wound    LOCAL MEDICATIONS USED:  MARCAINE    and OTHER Exparel  SPECIMEN   left femoral head   DISPOSITION OF SPECIMEN:  PATHOLOGY  COUNTS:  YES  TOURNIQUET:  * No tourniquets in log *  IMPLANTS: Medacta 1 lateralized AMIS stem with a 50 mm Mpact DM cup and liner  DICTATION: .Dragon Dictation   The patient was brought to the operating room and after spinal anesthesia was obtained patient was placed on the operative table with the ipsilateral foot into the Medacta attachment, contralateral leg on a well-padded table. C-arm was brought in and preop template x-ray taken. After prepping and draping in usual sterile fashion appropriate patient identification and timeout procedures were completed. Anterior approach to the hip was obtained and centered over the greater trochanter and TFL muscle. The subcutaneous tissue was incised hemostasis being achieved by electrocautery. TFL fascia was incised and the muscle retracted laterally deep retractor placed. The lateral femoral circumflex vessels were identified and ligated. The anterior capsule was exposed and a capsulotomy performed. The neck was identified and a femoral neck cut carried out with a saw. The head was removed without difficulty and showed sclerotic femoral head and acetabulum. Reaming was carried out to 50 mm and a 50 mm cup trial gave appropriate tightness to the acetabular component a 50 cup was impacted into position. The leg was then externally rotated and  ischiofemoral and pubofemoral releases carried out. The femur was sequentially broached to a size 1, size 1 lateralized with S head trials were placed and the final components chosen. The 1 lateralized stem was inserted along with a metal S 28 mm head and 50 mm liner. The hip was reduced and was stable the wound was thoroughly irrigated with fibrillar placed along the posterior capsule and medial neck. The deep fascia ws closed using a heavy Quill after infiltration of 30 cc of quarter percent Sensorcaine with epinephrine diluted with Exparel throughout the case .3-0 V-loc to close the skin with skin staples.  Incisional wound VAC applied and patient was sent to recovery in stable condition.   PLAN OF CARE: Admit for overnight observation

## 2021-06-24 NOTE — Evaluation (Signed)
Physical Therapy Evaluation Patient Details Name: Kayla Wade MRN: [id number] DOB: 1951/03/22 Today's Date: 06/24/2021  History of Present Illness  Pt underwent elective L hip TKA with anterior approach on 10/20 with Dr. Rudene Christians. PMH: Depression, CERD, HTN, hypercholesterolemia.  Clinical Impression  Pt is a pleasant 70 year old female who presents to PT evaluation POD #0 L THA anterior approach. Pt is currently WBAT and has no movement restrictions. Pt reports being independent at baseline without usage of any assistive device. Upon evaluation, pt requiring minA + HOB elevated for bed mobility, minA to stand with RW, and minA for small steps to the recliner chair. Pt demonstrating L knee buckling likely due to effects of anesthesia. Pt limited by post op nausea and vomiting, pain, decreased strength and sensation. Recommending home with HHPT at discharge + 24/7 assistance which family is able to provide. Will continue to work with patient during acute hospitalization to improve overall mobility and improve patient safety when ready to discharge.    This entire session was guided, instructed, and directly supervised by Greggory Stallion, DPT.    Recommendations for follow up therapy are one component of a multi-disciplinary discharge planning process, led by the attending physician.  Recommendations may be updated based on patient status, additional functional criteria and insurance authorization.  Follow Up Recommendations Home health PT;Supervision for mobility/OOB    Equipment Recommendations  None recommended by PT    Recommendations for Other Services       Precautions / Restrictions Precautions Precautions: Fall;Anterior Hip Restrictions Weight Bearing Restrictions: Yes LLE Weight Bearing: Weight bearing as tolerated      Mobility  Bed Mobility Overal bed mobility: Needs Assistance Bed Mobility: Supine to Sit     Supine to sit: Min assist;HOB elevated Sit to supine: Mod assist    General bed mobility comments: MinA for management of L LE and cues for usage of bed rails for management of trunk    Transfers Overall transfer level: Needs assistance Equipment used: Rolling walker (2 wheeled) Transfers: Sit to/from [facility] Sit to Stand: Min assist Stand pivot transfers: Min assist       General transfer comment: MinA for sit to stand from bed and minA for taking steps to the chair. L knee buckling likely due to anesthesia. Cues for RW management and usage of UEs to off weight L LE as needed  Ambulation/Gait             General Gait Details: Deferred due to L knee buckling likely due to impaired sensation from anesthesia  Stairs            Wheelchair Mobility    Modified Rankin (Stroke Patients Only)       Balance Overall balance assessment: Needs assistance Sitting-balance support: Single extremity supported;Feet supported Sitting balance-Leahy Scale: Good     Standing balance support: Bilateral upper extremity supported;During functional activity Standing balance-Leahy Scale: Fair Standing balance comment: Requiring UE assistance on RW for balance.                             Pertinent Vitals/Pain Pain Assessment: 0-10 Pain Score: 6  Pain Location: L hip Pain Descriptors / Indicators: Dull;Aching;Pressure Pain Intervention(s): Limited activity within patient's tolerance;Monitored during session;Repositioned;Relaxation    Home Living Family/patient expects to be discharged to:: Private residence Living Arrangements: Alone Available Help at Discharge: Family;Available PRN/intermittently Type of Home: House Home Access: Stairs to enter Entrance Stairs-Rails: Left  Entrance Stairs-Number of Steps: 2 Home Layout: Two level;Laundry or work area in basement;Able to live on main level with bedroom/bathroom Home Equipment: Environmental consultant - 2 wheels;Cane - single point;Shower seat;Bedside commode Additional Comments:  Family reporting they have set up 24/7 assistance/ supervision for the first week after discharge    Prior Function Level of Independence: Independent         Comments: No prior usage of AD     Hand Dominance        Extremity/Trunk Assessment   Upper Extremity Assessment Upper Extremity Assessment: Overall WFL for tasks assessed    Lower Extremity Assessment Lower Extremity Assessment: LLE deficits/detail LLE Deficits / Details: s/p L THA anterior approachy LLE: Unable to fully assess due to pain LLE Sensation: WNL (Some decreased sensation upon evaluation) LLE Coordination: WNL       Communication   Communication: No difficulties  Cognition Arousal/Alertness: Awake/alert Behavior During Therapy: WFL for tasks assessed/performed Overall Cognitive Status: Within Functional Limits for tasks assessed                                 General Comments: A&O x 4      General Comments      Exercises Total Joint Exercises Ankle Circles/Pumps: AROM;Strengthening;Left;10 reps;Seated Long Arc Quad: AROM;Strengthening;Left;20 reps;Seated Other Exercises    Assessment/Plan    PT Assessment Patient needs continued PT services  PT Problem List Decreased strength;Decreased range of motion;Decreased activity tolerance;Decreased balance;Decreased mobility;Decreased coordination;Decreased knowledge of use of DME;Decreased safety awareness;Decreased knowledge of precautions;Impaired sensation;Pain       PT Treatment Interventions DME instruction;Gait training;Therapeutic exercise;Therapeutic activities;Functional mobility training;Stair training;Balance training;Neuromuscular re-education;Patient/family education;Manual techniques;Modalities    PT Goals (Current goals can be found in the Care Plan section)  Acute Rehab PT Goals Patient Stated Goal: to no longer have hip pain PT Goal Formulation: With patient Time For Goal Achievement: 07/08/21 Potential to  Achieve Goals: Good    Frequency BID   Barriers to discharge        Co-evaluation               AM-PAC PT "6 Clicks" Mobility  Outcome Measure Help needed turning from your back to your side while in a flat bed without using bedrails?: A Little Help needed moving from lying on your back to sitting on the side of a flat bed without using bedrails?: A Little Help needed moving to and from a bed to a chair (including a wheelchair)?: A Little Help needed standing up from a chair using your arms (e.g., wheelchair or bedside chair)?: A Little Help needed to walk in hospital room?: A Lot Help needed climbing 3-5 steps with a railing? : A Lot 6 Click Score: 16    End of Session Equipment Utilized During Treatment: Gait belt Activity Tolerance: Patient limited by pain Patient left: in bed;with call bell/phone within reach;with SCD's reapplied;with chair alarm set Nurse Communication: Mobility status PT Visit Diagnosis: Unsteadiness on feet (R26.81);Other abnormalities of gait and mobility (R26.89);Muscle weakness (generalized) (M62.81)    Time: 1510-1531 PT Time Calculation (min) (ACUTE ONLY): 21 min   Charges:   PT Evaluation $PT Eval Low Complexity: 1 Low PT Treatments $Therapeutic Exercise: 8-22 mins        Andrey Campanile, SPT   Andrey Campanile 06/24/2021, 3:57 PM

## 2021-06-24 NOTE — Transfer of Care (Signed)
Immediate Anesthesia Transfer of Care Note  Patient: Kayla Wade  Procedure(s) Performed: TOTAL HIP ARTHROPLASTY ANTERIOR APPROACH (Left: Hip)  Patient Location: PACU  Anesthesia Type:Spinal  Level of Consciousness: awake and patient cooperative  Airway & Oxygen Therapy: Patient Spontanous Breathing  Post-op Assessment: Report given to RN and Post -op Vital signs reviewed and stable  Post vital signs: Reviewed and stable  Last Vitals:  Vitals Value Taken Time  BP 113/53 06/24/21 1117  Temp 36.3 C 06/24/21 1117  Pulse 83 06/24/21 1118  Resp 21 06/24/21 1118  SpO2 99 % 06/24/21 1118  Vitals shown include unvalidated device data.  Last Pain:  Vitals:   06/24/21 0709  TempSrc: Temporal  PainSc: 5          Complications: No notable events documented.

## 2021-06-24 NOTE — Evaluation (Signed)
Occupational Therapy Evaluation Patient Details Name: Kayla Wade MRN: [id number] DOB: April 15, 1951 Today's Date: 06/24/2021   History of Present Illness Pt underwent elective L hip TKA with anterior approach on 10/20 with Dr. Rudene Christians. PMH: Depression, CERD, HTN, hypercholesterolemia.   Clinical Impression   Kayla Wade presents today with pain and limited ROM in L LE, reduced endurance, generalized weakness. Pt lives alone but will have family staying with her 24/7 for at least the first week post DC. She endorses 1 fall in previous 6 months. Pt was IND in ADL/IADL/fxl mobility prior to admission. Provided educ today re: bed mobility, transfers, AE for LB dressing and bathing, falls prevention, home modification. Pt and family voiced understanding. Mod A for bed mobility and transfers. Pt reports nausea and a has several episodes of vomiting during OT session. Patient can benefit from ongoing OT while hospitalized, and HHOT post DC to ensure return to PLOF without pain and with reduced falls risk.     Recommendations for follow up therapy are one component of a multi-disciplinary discharge planning process, led by the attending physician.  Recommendations may be updated based on patient status, additional functional criteria and insurance authorization.   Follow Up Recommendations  Home health OT    Equipment Recommendations  None recommended by OT    Recommendations for Other Services       Precautions / Restrictions Precautions Precautions: Fall;Anterior Hip Restrictions Weight Bearing Restrictions: Yes LLE Weight Bearing: Weight bearing as tolerated      Mobility Bed Mobility Overal bed mobility: Needs Assistance Bed Mobility: Supine to Sit     Supine to sit: Min assist;HOB elevated Sit to supine: Mod assist   General bed mobility comments: MinA for management of L LE and cues for usage of bed rails for management of trunk    Transfers Overall transfer level: Needs  assistance Equipment used: Rolling walker (2 wheeled) Transfers: Sit to/from [facility] Sit to Stand: Min assist Stand pivot transfers: Min assist       General transfer comment: MinA for sit to stand from bed and minA for taking steps to the chair. L knee buckling likely due to anesthesia. Cues for RW management and usage of UEs to off weight L LE as needed    Balance Overall balance assessment: Needs assistance Sitting-balance support: Single extremity supported;Feet supported Sitting balance-Leahy Scale: Good     Standing balance support: Bilateral upper extremity supported;During functional activity Standing balance-Leahy Scale: Fair Standing balance comment: Requiring UE assistance on RW for balance.                           ADL either performed or assessed with clinical judgement   ADL Overall ADL's : Needs assistance/impaired                                             Vision         Perception     Praxis      Pertinent Vitals/Pain Pain Assessment: 0-10 Pain Score: 6  Pain Location: L hip Pain Descriptors / Indicators: Dull;Aching;Pressure Pain Intervention(s): Limited activity within patient's tolerance;Monitored during session;Repositioned;Relaxation     Hand Dominance     Extremity/Trunk Assessment Upper Extremity Assessment Upper Extremity Assessment: Overall WFL for tasks assessed   Lower Extremity Assessment Lower Extremity Assessment: LLE deficits/detail  LLE Deficits / Details: s/p L THA anterior approachy LLE: Unable to fully assess due to pain LLE Sensation: WNL (Some decreased sensation upon evaluation) LLE Coordination: WNL       Communication Communication Communication: No difficulties   Cognition Arousal/Alertness: Awake/alert Behavior During Therapy: WFL for tasks assessed/performed Overall Cognitive Status: Within Functional Limits for tasks assessed                                  General Comments: A&O x 4   General Comments       Exercises Exercises: Total Joint Total Joint Exercises Ankle Circles/Pumps: AROM;Strengthening;Left;10 reps;Seated Long Arc Quad: AROM;Strengthening;Left;20 reps;Seated Other Exercises Other Exercises: Educ re: AE for LB dressing, bathing; falls prevention; home modification; car transfer techniques   Shoulder Instructions      Home Living Family/patient expects to be discharged to:: Private residence Living Arrangements: Alone Available Help at Discharge: Family;Available PRN/intermittently Type of Home: House Home Access: Stairs to enter [employer] of Steps: 2 Entrance Stairs-Rails: Left Home Layout: Two level;Laundry or work area in basement;Able to live on main level with bedroom/bathroom     Bathroom Shower/Tub: Occupational psychologist: [place]: Environmental consultant - 2 wheels;Cane - single point;Shower seat;Bedside commode   Additional Comments: Family reporting they have set up 24/7 assistance/ supervision for the first week after discharge      Prior Functioning/Environment Level of Independence: Independent        Comments: No prior usage of AD        OT Problem List: Decreased strength;Impaired balance (sitting and/or standing);Decreased knowledge of precautions;Decreased range of motion;Decreased activity tolerance;Decreased coordination      OT Treatment/Interventions: Self-care/ADL training;DME and/or AE instruction;Therapeutic activities;Balance training;Therapeutic exercise;Energy conservation;Patient/family education    OT Goals(Current goals can be found in the care plan section) Acute Rehab OT Goals Patient Stated Goal: to no longer have hip pain OT Goal Formulation: With patient Time For Goal Achievement: 07/08/21 Potential to Achieve Goals: Good ADL Goals Pt Will Perform Lower Body Bathing: with modified independence;sit to/from stand Pt Will Perform  Lower Body Dressing: sit to/from stand;with modified independence Pt Will Transfer to Toilet: with modified independence;regular height toilet (using LRAD)  OT Frequency: Min 1X/week   Barriers to D/C:            Co-evaluation              AM-PAC OT "6 Clicks" Daily Activity     Outcome Measure Help from another person eating meals?: None Help from another person taking care of personal grooming?: A Little Help from another person toileting, which includes using toliet, bedpan, or urinal?: A Lot Help from another person bathing (including washing, rinsing, drying)?: A Lot Help from another person to put on and taking off regular upper body clothing?: A Little Help from another person to put on and taking off regular lower body clothing?: A Lot 6 Click Score: 16   End of Session    Activity Tolerance: Patient tolerated treatment well;Other (comment) (limited by nausea, vomiting) Patient left: in bed;with family/visitor present;with call bell/phone within reach;with chair alarm set  OT Visit Diagnosis: Unsteadiness on feet (R26.81);Muscle weakness (generalized) (M62.81);Other abnormalities of gait and mobility (R26.89)                Time: 3818-2993 OT Time Calculation (min): 17 min Charges:  OT General Charges $OT Visit:  1 Visit OT Evaluation $OT Eval Low Complexity: 1 Low OT Treatments $Self Care/Home Management : 8-22 mins Josiah Lobo, PhD, MS, OTR/L 06/24/21, 3:55 PM

## 2021-06-24 NOTE — H&P (Signed)
Chief Complaint  Patient presents with   Discuss surgery  Left Hip OA    History of the Present Illness: Kayla Wade is a 70 y.o. female here today.   The patient presents for evaluation of significant left hip osteoarthritis. She had x-rays taken by Cameron Proud, PA-C here on 05/28/2021 showing severe left hip central osteoarthritis, less severe on the right, complete loss of central joint space. The patient presents with a female companion.   The patient states she has constant pain to her left hip. The companion states the patient was told she had arthritis, and they got an x-ray in 09/2020, and they never told her she had osteophytes. The patient states she had left hip pain 1 year before that time. She states that is why she fell down the steps. She also had an ankle and knee injury from the fall. The patient states she has pain when she goes up and down the stairs. She states she has a limp. She states Tylenol did not help.  The patient is retired. She worked in Tourist information centre manager work. She lives in [place], [place].  I have reviewed past medical, surgical, social and family history, and allergies as documented in the EMR.  Past Medical History: Past Medical History:  Diagnosis Date   Depressive disorder, not elsewhere classified 04/28/2011  hx of [facility] admission   GERD (gastroesophageal reflux disease)   Goiter   H/O craniotomy  poor short term memory   Hypertension   Pure hypercholesterolemia 04/28/2011   Urethral stricture unspecified 04/28/2011   Past Surgical History: Past Surgical History:  Procedure Laterality Date   BREAST EXCISIONAL BIOPSY Left 01/22/2013   BREAST LESION Right 1998  RIGHT BREAST LESION REMOVAL-BENIGN   CHOLECYSTECTOMY   COLONOSCOPY 04/11/2018   CRANIOTOMY  WITH VENTRICULAR CYST DRAINAGE   ELBOW SURGERY   HYSTERECTOMY  PARTIAL DUE TO BLEEDING   KIDNEY STONE SURGERY   TONSILLECTOMY   TUBAL LIGATION  BTL   Past Family History: Family History   Problem Relation Age of Onset   High blood pressure (Hypertension) Mother   Thyroid cancer Mother   Skin cancer, non-melanoma Mother   Diabetes type II Mother   Myocardial Infarction (Heart attack) Mother   Heart failure Mother  died 1s   Other Father  died spinal meningitis   Lung cancer Sister   COPD Sister   Breast cancer Neg Hx   Medications: Current Outpatient Medications Ordered in Epic  Medication Sig Dispense Refill   cyanocobalamin (VITAMIN B12) 1000 MCG tablet Take 2 tablets (2,000 mcg total) by mouth once daily 400 tablet 1   enalapril (VASOTEC) 20 MG tablet Take 1 tablet (20 mg total) by mouth 2 (two) times daily for 90 days 180 tablet 3   ibuprofen (MOTRIN) 600 MG tablet Take 1 tablet (600 mg total) by mouth every 8 (eight) hours as needed for Pain 60 tablet 0   magnesium chloride (SLOW-MAG) 71.5 mg DR tablet Take 1 tablet by mouth once daily Each tablet contains 71.5 mg of magnesium 30 tablet 11   magnesium oxide (MAG-OX) 400 mg (241.3 mg magnesium) tablet Take 2 tablets (800 mg total) by mouth 2 (two) times daily 120 tablet 11   melatonin 10 mg Tab Take 10 mg by mouth once daily   metFORMIN (GLUCOPHAGE-XR) 500 MG XR tablet Take 1 tablet (500 mg total) by mouth 2 (two) times daily 180 tablet 2   spironolactone (ALDACTONE) 25 MG tablet Take 1 tablet (25 mg  total) by mouth once daily 30 tablet 11   No current Epic-ordered facility-administered medications on file.   Allergies: Allergies  Allergen Reactions   Tussionex Pennkinetic Er [Hydrocodone-Chlorpheniramine] Vomiting   Ambien [Zolpidem] Hallucination   Erythromycin Nausea   Sulfa (Sulfonamide Antibiotics) Vomiting    Body mass index is 26.43 kg/m.  Review of Systems: A comprehensive 14 point ROS was performed, reviewed, and the pertinent orthopaedic findings are documented in the HPI.  Vitals:  06/14/21 0813  BP: 104/70    General Physical Examination:   General/Constitutional: No apparent  distress: well-nourished and well developed. Eyes: Pupils equal, round with synchronous movement. Lungs: Clear to auscultation HEENT: Full upper and partial lower denture. Vascular: No edema, swelling or tenderness, except as noted in detailed exam. Cardiac: Heart rate and rhythm is regular. Integumentary: No impressive skin lesions present, except as noted in detailed exam. Neuro/Psych: Normal mood and affect, oriented to person, place and time.  On exam, antalgic gait. Left leg has a 10 degree flexion contracture. Left hip range of motion of -10 to 20 degrees.  Radiographs:  No new imaging studies were obtained today.  Assessment: ICD-10-CM  1. Primary osteoarthritis of left hip M16.12   Plan:  The patient has clinical findings of severe left hip central osteoarthritis, less severe on the right, and complete loss of central joint space.  We discussed the patient's prior x-ray findings. I explained she has severe left hip osteoarthritis. I recommend left total hip arthroplasty within the next month. I explained the surgery and postoperative course in detail. She received a booklet on hip arthroplasty from Rachelle Hora, [place] in the past. We also discussed risk of blood clot and infection.  We will schedule the patient for left total hip arthroplasty within the next month.  Surgical Risks:  The nature of the condition and the proposed procedure has been reviewed in detail with the patient. Surgical versus non-surgical options and prognosis for recovery have been reviewed and the inherent risks and benefits of each have been discussed including the risks of infection, bleeding, injury to nerves/blood vessels/tendons, incomplete relief of symptoms, persisting pain and/or stiffness, loss of function, complex regional pain syndrome, failure of the procedure, as appropriate.  Teeth: Full upper and partial lower denture.  Scribe Attestation: I, Dawn Royse, am acting as scribe for [employer], MD.   Electronically signed by Lauris Poag, MD at 06/14/2021 5:49 PM EDT  Reviewed  H+P. No changes noted.

## 2021-06-24 NOTE — Anesthesia Preprocedure Evaluation (Addendum)
Anesthesia Evaluation  Patient identified by MRN, date of birth, ID band Patient awake    Reviewed: Allergy & Precautions, NPO status , Patient's Chart, lab work & pertinent test results  History of Anesthesia Complications (+) PONV and history of anesthetic complications  Airway Mallampati: II  TM Distance: >3 FB Neck ROM: Full    Dental  (+) Edentulous Upper   Pulmonary neg pulmonary ROS, neg sleep apnea, neg COPD,    breath sounds clear to auscultation- rhonchi (-) wheezing      Cardiovascular hypertension, Pt. on medications (-) CAD, (-) Past MI, (-) Cardiac Stents and (-) CABG  Rhythm:Regular Rate:Normal - Systolic murmurs and - Diastolic murmurs    Neuro/Psych  Headaches, neg Seizures PSYCHIATRIC DISORDERS Depression Bipolar Disorder    GI/Hepatic Neg liver ROS, GERD  ,  Endo/Other  diabetes, Oral Hypoglycemic Agents  Renal/GU Renal InsufficiencyRenal disease     Musculoskeletal  (+) Arthritis ,   Abdominal (+) - obese,   Peds  Hematology negative hematology ROS (+)   Anesthesia Other Findings Past Medical History: No date: Acid reflux No date: Arthritis No date: Bipolar disorder (HCC) No date: Bradycardia 2010: 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 No date: High cholesterol No date: History of kidney stones     Comment:  h/o No date: Hypertension No date: Low magnesium level No date: PAC (premature atrial contraction) No date: PONV (postoperative nausea and vomiting)     Comment:  nausea only No date: PVC's (premature ventricular contractions) No date: Type 2 diabetes mellitus (HCC)   Reproductive/Obstetrics                             Lab Results  Component Value Date   WBC 9.0 06/18/2021   HGB 12.7 06/18/2021   HCT 37.5 06/18/2021   MCV 89.5 06/18/2021   PLT 345 06/18/2021    Anesthesia Physical Anesthesia Plan  ASA: 2  Anesthesia Plan:  Spinal   Post-op Pain Management:    Induction:   PONV Risk Score and Plan: 3  Airway Management Planned: Natural Airway  Additional Equipment:   Intra-op Plan:   Post-operative Plan:   Informed Consent: I have reviewed the patients History and Physical, chart, labs and discussed the procedure including the risks, benefits and alternatives for the proposed anesthesia with the patient or authorized representative who has indicated his/her understanding and acceptance.     Dental advisory given  Plan Discussed with: CRNA and Anesthesiologist  Anesthesia Plan Comments:         Anesthesia Quick Evaluation

## 2021-06-24 NOTE — Progress Notes (Signed)
Patient arrived to 142 in NAD, VS stable and patient free from pain. Patient oriented to room and call bell in reach.

## 2021-06-24 NOTE — Anesthesia Procedure Notes (Signed)
Spinal  Patient location during procedure: OR Start time: 06/24/2021 9:33 AM End time: 06/24/2021 9:44 AM Reason for block: surgical anesthesia Staffing Performed: resident/CRNA  Anesthesiologist: Emmie Niemann, MD Resident/CRNA: Rona Ravens, CRNA Preanesthetic Checklist Completed: patient identified, IV checked, site marked, risks and benefits discussed, surgical consent, monitors and equipment checked, pre-op evaluation and timeout performed Spinal Block Patient position: sitting Prep: Betadine Patient monitoring: heart rate, continuous pulse ox, blood pressure and cardiac monitor Approach: midline Location: L4-5 Injection technique: single-shot Needle Needle type: Whitacre and Introducer  Needle gauge: 24 G Needle length: 9 cm Assessment Events: CSF return Additional Notes Negative paresthesia. Negative blood return. Positive free-flowing CSF. Expiration date of kit checked and confirmed. Patient tolerated procedure well, without complications.

## 2021-06-25 DIAGNOSIS — M1612 Unilateral primary osteoarthritis, left hip: Secondary | ICD-10-CM | POA: Diagnosis not present

## 2021-06-25 LAB — BASIC METABOLIC PANEL
Anion gap: 8 (ref 5–15)
BUN: 19 mg/dL (ref 8–23)
CO2: 21 mmol/L — ABNORMAL LOW (ref 22–32)
Calcium: 8.3 mg/dL — ABNORMAL LOW (ref 8.9–10.3)
Chloride: 104 mmol/L (ref 98–111)
Creatinine, Ser: 0.98 mg/dL (ref 0.44–1.00)
GFR, Estimated: >60 mL/min (ref >60)
Glucose, Bld: 181 mg/dL — ABNORMAL HIGH (ref 70–99)
Potassium: 4.2 mmol/L (ref 3.5–5.1)
Sodium: 133 mmol/L — ABNORMAL LOW (ref 135–145)

## 2021-06-25 LAB — CBC
HCT: 26.8 % — ABNORMAL LOW (ref 36.0–46.0)
Hemoglobin: 9.3 g/dL — ABNORMAL LOW (ref 12.0–15.0)
MCH: 30 pg (ref 26.0–34.0)
MCHC: 34.7 g/dL (ref 30.0–36.0)
MCV: 86.5 fL (ref 80.0–100.0)
Platelets: 262 10*3/uL (ref 150–400)
RBC: 3.1 MIL/uL — ABNORMAL LOW (ref 3.87–5.11)
RDW: 11.6 % (ref 11.5–15.5)
WBC: 11.3 10*3/uL — ABNORMAL HIGH (ref 4.0–10.5)
nRBC: 0 % (ref 0.0–0.2)

## 2021-06-25 LAB — SURGICAL PATHOLOGY

## 2021-06-25 LAB — GLUCOSE, CAPILLARY
Glucose-Capillary: 211 mg/dL — ABNORMAL HIGH (ref 70–99)
Glucose-Capillary: 182 mg/dL — ABNORMAL HIGH (ref 70–99)

## 2021-06-25 MED ORDER — FE FUMARATE-B12-VIT C-FA-IFC PO CAPS
1.0000 | ORAL_CAPSULE | Freq: Two times a day (BID) | ORAL | Status: DC
  Filled 2021-06-25 (×2): qty 1

## 2021-06-25 MED ORDER — OXYCODONE HCL 5 MG PO TABS: 5.0000 mg | ORAL_TABLET | ORAL | 0 refills | Status: DC | PRN

## 2021-06-25 MED ORDER — ENOXAPARIN SODIUM 40 MG/0.4ML IJ SOSY: 40.0000 mg | PREFILLED_SYRINGE | INTRAMUSCULAR | 0 refills | Status: DC

## 2021-06-25 MED ORDER — POLYETHYLENE GLYCOL 3350 17 G PO PACK: 17.0000 g | PACK | Freq: Every day | ORAL | 0 refills | Status: AC | PRN

## 2021-06-25 MED ORDER — METHOCARBAMOL 500 MG PO TABS: 500.0000 mg | ORAL_TABLET | Freq: Four times a day (QID) | ORAL | 0 refills | Status: DC | PRN

## 2021-06-25 MED ORDER — DOCUSATE SODIUM 100 MG PO CAPS: 100.0000 mg | ORAL_CAPSULE | Freq: Two times a day (BID) | ORAL | 0 refills | Status: DC

## 2021-06-25 MED ORDER — TRAMADOL HCL 50 MG PO TABS: 50.0000 mg | ORAL_TABLET | Freq: Four times a day (QID) | ORAL | 0 refills | Status: DC | PRN

## 2021-06-25 NOTE — Progress Notes (Addendum)
Met with the patient in the room  She lives alone and will have family helping 24/7 the first week and as needed after She has DME at home including Rolling walker, Bed side Commode, Cane and shower seat and does not need additional DME She is on the list for [facility] to accept if they have a cancellation, I notified [facility] at [facility] that there was a cancellation I requested for [facility] to accept the patient, awaiting a responce She has transportation at home and can afford her medication   Update, [facility] notified me that they will not be accepting the patient, another patient at another hospital filled the vacant spot they had, for the cancellation I reached out to [facility] [place] and requested if they will be able to accept the patient, awaiting a response, [facility] is not able to accept the patient,   I reached out to Sarah Ann with [facility] and requested they accept the patient, awaiting a responce

## 2021-06-25 NOTE — Discharge Instructions (Signed)

## 2021-06-25 NOTE — Progress Notes (Signed)
Physical Therapy Treatment Patient Details Name: Kayla Wade MRN: [id number] DOB: 1950/12/19 Today's Date: 06/25/2021   History of Present Illness Pt underwent elective L hip TKA with anterior approach on 10/20 with Dr. Rudene Christians. PMH: Depression, CERD, HTN, hypercholesterolemia.    PT Comments    Pt POD#1 and reporting improvement in her pain level and nausea. Pt able to complete bed mobility with min - modA, and performed transfers with SPV from high and low surfaces. Pt able to ambulate in hallway with RW + CGA for safety. Pt negociated 4 steps with L rail only with verbal cues for proper sequencing. Pt educated on guarding techniques for family member to assist upon discharge. Discussed safe car transfers as well. No further questions provided by patient. Pt has all DME equipment she will need to discharge and will have 24/7 assistance from family members at discharge. Recommendation for HHPT are still appropriate at this time to improve functional mobility and safety at discharge.   Recommendations for follow up therapy are one component of a multi-disciplinary discharge planning process, led by the attending physician.  Recommendations may be updated based on patient status, additional functional criteria and insurance authorization.  Follow Up Recommendations  Home health PT;Supervision for mobility/OOB     Equipment Recommendations  None recommended by PT    Recommendations for Other Services       Precautions / Restrictions Precautions Precautions: Fall;Anterior Hip Restrictions Weight Bearing Restrictions: Yes LLE Weight Bearing: Weight bearing as tolerated     Mobility  Bed Mobility Overal bed mobility: Needs Assistance Bed Mobility: Supine to Sit;Sit to Supine     Supine to sit: Min assist;HOB elevated Sit to supine: Mod assist   General bed mobility comments: ModA back to bed for management of B LE due to pain    Transfers Overall transfer level: Needs  assistance Equipment used: Rolling walker (2 wheeled) Transfers: Sit to/from Stand Sit to Stand: Supervision         General transfer comment: Pt completing all transfers with SPV from low and high surfaces while using RW for assistance  Ambulation/Gait Ambulation/Gait assistance: Min guard Gait Distance (Feet): 95 Feet Assistive device: Rolling walker (2 wheeled) Gait Pattern/deviations: Step-to pattern;Decreased step length - left;Decreased stride length;Decreased dorsiflexion - left;Decreased weight shift to left;Antalgic Gait velocity: decreased   General Gait Details: Pt able to ambulate without any L knee buckling today. Cues for proper RW management during ambulation and cues for improved L heel strike to promote more normalized gait pattern   Stairs Stairs: Yes Stairs assistance: Min guard Stair Management: One rail Left;Step to pattern;Forwards Number of Stairs: 4 General stair comments: Pt cued for proper sequencing to improve safety and reduce pain during stair climbing. Instructed pt on where caregiver should stand to assist with safety during home entry/exit   Wheelchair Mobility    Modified Rankin (Stroke Patients Only)       Balance Overall balance assessment: Needs assistance Sitting-balance support: Feet supported;No upper extremity supported Sitting balance-Leahy Scale: Normal     Standing balance support: During functional activity;No upper extremity supported Standing balance-Leahy Scale: Fair Standing balance comment: Able to maintain static standing balance for ~5 seconds without UE assistance. Requiring UE assistance during dynamic balance activities                            Cognition Arousal/Alertness: Awake/alert Behavior During Therapy: WFL for tasks assessed/performed Overall Cognitive Status: Within Functional Limits  for tasks assessed                                 General Comments: A&O x 4      Exercises  Total Joint Exercises Ankle Circles/Pumps: AROM;Strengthening;10 reps;Both Quad Sets: AROM;Strengthening;Left;20 reps;Supine Gluteal Sets: AROM;Strengthening;Both;10 reps;Supine Heel Slides: AAROM;Strengthening;Left;10 reps;Supine Hip ABduction/ADduction: AAROM;Left;10 reps;Supine Long Arc Quad: Strengthening;Left;10 reps;Seated Other Exercises Other Exercises: Pt given and reviewed HEP handout. No further questions    General Comments        Pertinent Vitals/Pain Pain Assessment: 0-10 Pain Score: 8  Pain Location: L hip Pain Descriptors / Indicators: Dull;Aching;Pressure Pain Intervention(s): Limited activity within patient's tolerance;Monitored during session;Premedicated before session;Relaxation    Home Living                      Prior Function            PT Goals (current goals can now be found in the care plan section) Acute Rehab PT Goals Patient Stated Goal: to no longer have hip pain PT Goal Formulation: With patient Time For Goal Achievement: 07/08/21 Potential to Achieve Goals: Good Progress towards PT goals: Progressing toward goals    Frequency    BID      PT Plan Current plan remains appropriate    Co-evaluation              AM-PAC PT "6 Clicks" Mobility   Outcome Measure  Help needed turning from your back to your side while in a flat bed without using bedrails?: A Little Help needed moving from lying on your back to sitting on the side of a flat bed without using bedrails?: A Little Help needed moving to and from a bed to a chair (including a wheelchair)?: A Little Help needed standing up from a chair using your arms (e.g., wheelchair or bedside chair)?: None Help needed to walk in hospital room?: A Little Help needed climbing 3-5 steps with a railing? : A Little 6 Click Score: 19    End of Session Equipment Utilized During Treatment: Gait belt Activity Tolerance: Patient limited by pain Patient left: in bed;with call  bell/phone within reach;with SCD's reapplied;with chair alarm set Nurse Communication: Mobility status PT Visit Diagnosis: Unsteadiness on feet (R26.81);Other abnormalities of gait and mobility (R26.89);Muscle weakness (generalized) (M62.81)     Time: 3976-7341 PT Time Calculation (min) (ACUTE ONLY): 33 min  Charges:  $Gait Training: 8-22 mins $Therapeutic Exercise: 8-22 mins                     Andrey Campanile, SPT    Andrey Campanile 06/25/2021, 12:27 PM

## 2021-06-25 NOTE — Anesthesia Postprocedure Evaluation (Signed)
Anesthesia Post Note  Patient: Kayla Wade  Procedure(s) Performed: TOTAL HIP ARTHROPLASTY ANTERIOR APPROACH (Left: Hip)  Patient location during evaluation: Nursing Unit Anesthesia Type: Spinal Level of consciousness: awake and alert and oriented Pain management: pain level controlled Vital Signs Assessment: post-procedure vital signs reviewed and stable Respiratory status: respiratory function stable Cardiovascular status: stable Postop Assessment: no headache, no backache, patient able to bend at knees, no apparent nausea or vomiting, able to ambulate and adequate PO intake Anesthetic complications: no   No notable events documented.   Last Vitals:  Vitals:   06/25/21 0444 06/25/21 0716  BP: (!) 117/57 (!) 126/54  Pulse: 83 79  Resp: 20 19  Temp: 37.3 C 36.8 C  SpO2: 97% 97%    Last Pain:  Vitals:   06/25/21 0716  TempSrc:   PainSc: 2                  Lanora Manis

## 2021-06-25 NOTE — Discharge Summary (Signed)
Physician Discharge Summary  Patient ID: Kayla Wade MRN: [id number] DOB/AGE: 1951/08/15 70 y.o.  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   HYPERLIPIDEMIA 02/01/2007   COMMON MIGRAINE 02/01/2007   HYPERTENSION 02/01/2007   EDEMA 02/01/2007    Past Medical History:  Diagnosis Date   Acid reflux    Arthritis    Bipolar disorder ([place])    Bradycardia    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 female who was admitted 06/24/2021 with a diagnosis of left hip osteoarthritis and went to the operating room on 06/24/2021 and underwent the above named procedures.    Surgeries: Procedure(s): TOTAL HIP ARTHROPLASTY ANTERIOR APPROACH on 06/24/2021 Patient tolerated the surgery well. Taken to PACU where she was stabilized and then transferred to the orthopedic floor.  Started on Lovenox 40 mg q 24 hrs. Foot pumps applied bilaterally at 80 mm. Heels elevated on bed with rolled towels. No evidence of DVT. Negative [place]. Physical therapy started on day #1 for gait training  and transfer. OT started day #1 for ADL and assisted devices.  On postop day 1, pain well controlled.  Patient made excellent progress with physical therapy and completed all PT goals.  She is tolerating p.o. well.  No complaints.  Patient's foley was d/c on day #1. Patient's IV was d/c on day #1.  On post op day #1 patient was stable and ready for discharge to home with HHPT.    She was given perioperative antibiotics:  Anti-infectives (From admission, onward)    Start     Dose/Rate Route Frequency Ordered Stop   06/24/21 1545  ceFAZolin (ANCEF) IVPB 2g/100 mL premix        2 g 200 mL/hr over 30 Minutes Intravenous Every 6 hours 06/24/21 1244 06/24/21 2312   06/24/21 0712  ceFAZolin (ANCEF) 2-4 GM/100ML-% IVPB       Note to Pharmacy: Shary Key   : cabinet override      06/24/21 0712 06/24/21 0956   06/24/21 0600  ceFAZolin (ANCEF) IVPB 2g/100 mL premix        2 g 200 mL/hr over 30 Minutes Intravenous On call to O.R. 06/24/21 0105 06/24/21 0944     .  She was given sequential compression devices, early ambulation, and Lovenox TEDs for DVT prophylaxis.  She benefited maximally from the hospital stay and there were no complications.    Recent vital signs:  Vitals:   06/25/21 0716 06/25/21 1000  BP: (!) 126/54 (!) 100/57  Pulse: 79 86  Resp: 19 18  Temp: 98.3 F (36.8 C) 98.8 F (37.1 C)  SpO2: 97% 97%    Recent laboratory studies:  Lab Results  Component Value Date   HGB 9.3 (L) 06/25/2021   HGB 10.1 (L) 06/24/2021   HGB 12.7 06/18/2021   Lab Results  Component Value Date   WBC 11.3 (H) 06/25/2021   PLT 262 06/25/2021   No results found for: INR Lab Results  Component Value Date   NA 133 (L) 06/25/2021   K 4.2 06/25/2021   CL 104 06/25/2021   CO2 21 (L) 06/25/2021   BUN 19 06/25/2021   CREATININE 0.98 06/25/2021   GLUCOSE 181 (H) 06/25/2021    Discharge Medications:   Allergies as of 06/25/2021       Reactions   Tussionex Pennkinetic Er [hydrocod  Polst-cpm Polst Er] Nausea Only   Zolpidem Other (See Comments)   hallucinations   Erythromycin Base Rash        Medication List     STOP taking these medications    ibuprofen 600 MG tablet Commonly known as: ADVIL       TAKE these medications    docusate sodium 100 MG capsule Commonly known as: COLACE Take 1 capsule (100 mg total) by mouth 2 (two) times daily.   enalapril 20 MG tablet Commonly known as: VASOTEC Take 20 mg by mouth 2 (two) times daily.   enoxaparin 40 MG/0.4ML injection Commonly known as: LOVENOX Inject 0.4 mLs (40 mg total) into the skin daily for 14 days. Start taking on: June 26, 2021   famotidine 20 MG tablet Commonly known as: PEPCID Take 20 mg by mouth daily as needed for heartburn or indigestion.   magnesium oxide 400 (240 Mg) MG tablet Commonly known as: MAG-OX Take 800 mg by mouth 2 (two) times daily.   metFORMIN 500 MG 24 hr tablet Commonly known as: GLUCOPHAGE-XR Take 500 mg by mouth 2 (two) times daily.   methocarbamol 500 MG tablet Commonly known as: ROBAXIN Take 1 tablet (500 mg total) by mouth every 6 (six) hours as needed for muscle spasms.   multivitamin with minerals tablet Take 1 tablet by mouth daily.   oxyCODONE 5 MG immediate release tablet Commonly known as: Oxy IR/ROXICODONE Take 1-2 tablets (5-10 mg total) by mouth every 4 (four) hours as needed for moderate pain (pain score 4-6).   polyethylene glycol 17 g packet Commonly known as: MIRALAX / GLYCOLAX Take 17 g by mouth daily as needed for mild constipation.   Slow-Mag 64 MG Tbec SR tablet Generic drug: magnesium chloride Take 1 tablet by mouth daily.   spironolactone 25 MG tablet Commonly known as: ALDACTONE Take 25 mg by mouth daily.   traMADol 50 MG tablet Commonly known as: ULTRAM Take 1 tablet (50 mg total) by mouth every 6 (six) hours as needed. What changed:  when to take this reasons to take this   vitamin B-12 1000 MCG tablet Commonly  known as: CYANOCOBALAMIN Take 2,000 mcg by mouth daily.               Durable Medical Equipment  (From admission, onward)           Start     Ordered   06/24/21 1245  DME Bedside commode  Once       Question:  Patient needs a bedside commode to treat with the following condition  Answer:  Status post total hip replacement, left   06/24/21 1244   06/24/21  1245  DME 3 n 1  Once        06/24/21 1244   06/24/21 1245  DME Walker rolling  Once       Question Answer Comment  Walker: With 5 Inch Wheels   Patient needs a walker to treat with the following condition Status post total hip replacement, left      06/24/21 1244            Diagnostic Studies: DG HIP OPERATIVE UNILAT W OR W/O PELVIS LEFT  Result Date: 06/24/2021 CLINICAL DATA:  Left hip replacement. EXAM: OPERATIVE left HIP (WITH PELVIS IF PERFORMED) 3 VIEWS TECHNIQUE: Fluoroscopic spot image(s) were submitted for interpretation post-operatively. Radiation exposure index: 1.4291 mGy. COMPARISON:  September 16, 2020. FINDINGS: Three intraoperative fluoroscopic images were obtained of the left hip. The left femoral and acetabular components are well situated. IMPRESSION: Fluoroscopic guidance provided during left total hip arthroplasty. Electronically Signed   By: Marijo Conception M.D.   On: 06/24/2021 14:02   DG HIP UNILAT W OR W/O PELVIS 2-3 VIEWS LEFT  Result Date: 06/24/2021 CLINICAL DATA:  Status post left hip arthroplasty EXAM: DG HIP (WITH OR WITHOUT PELVIS) 2-3V LEFT COMPARISON:  None. FINDINGS: Status post left hip total arthroplasty with expected overlying postoperative change. No evidence of perihardware fracture or component malpositioning. IMPRESSION: Status post left hip total arthroplasty with expected overlying postoperative change. Electronically Signed   By: Delanna Ahmadi M.D.   On: 06/24/2021 12:24    Disposition:      Follow-up Information     Duanne Guess, PA-C Follow up in 2 week(s).    Specialties: Orthopedic Surgery, Emergency Medicine Contact information: [place] [address] [phone]                  Signed: Feliberto Gottron 06/25/2021, 12:13 PM

## 2021-06-25 NOTE — TOC Progression Note (Addendum)
Transition of Care [facility]) - Progression Note    Patient Details  Name: Kayla Wade MRN: [id number] Date of Birth: 1951/02/08  Transition of Care [facility]) CM/SW [place], RN Phone Number: 06/25/2021, 3:37 PM  Clinical Narrative:     Kayla Wade with [facility] notified me that they will not be able to see the patient over the weekend, I asked if they would be able to see her on Monday, Awaiting to hear back, I also contacted Sarah with [facility] and Cristie Hem with [facility] to see if any of them could accept the patient, awaiting to hear back  [facility] notified me that they would not be able to accept the patient for [facility] Notified me that they would not be able to accept the patient I reached out to Dex[place] at [facility], She stated that they are not able to accept the patient due to staffing. I reached out to [facility] and requested that they accept the patient, Awaiting to hear back  I notified the surgeon that I was not able to get Home health services set up with any of the companies that We work with,  I requested that they get the patient Outpatient PT services set up instead of [employer]  Expected Discharge Plan: [place] Barriers to Discharge: Continued Medical Work up  Expected Discharge Plan and Services Expected Discharge Plan: [place]   Discharge Planning Services: CM Consult   Living arrangements for the past 2 months: Single Family Home Expected Discharge Date: 06/25/21               DME Arranged: N/A         HH Arranged: PT HH Agency: [facility] ([place]) Date HH Agency Contacted: 06/25/21 Time [place]: [phone] Representative spoke with at [facility]: [place] ([place]) Interventions    Readmission Risk Interventions No flowsheet data found.

## 2021-08-02 ENCOUNTER — Other Ambulatory Visit: Payer: Self-pay | Admitting: Family Medicine

## 2021-08-02 DIAGNOSIS — Z1231 Encounter for screening mammogram for malignant neoplasm of breast: Secondary | ICD-10-CM

## 2021-08-26 ENCOUNTER — Ambulatory Visit
Admission: RE | Admit: 2021-08-26 | Discharge: 2021-08-26 | Disposition: A | Discharge status: Home / Self Care | Payer: Medicare Other | Source: Ambulatory Visit | Attending: Family Medicine | Admitting: Family Medicine

## 2021-08-26 ENCOUNTER — Other Ambulatory Visit: Payer: Self-pay

## 2021-08-26 DIAGNOSIS — Z1231 Encounter for screening mammogram for malignant neoplasm of breast: Secondary | ICD-10-CM

## 2021-09-10 ENCOUNTER — Other Ambulatory Visit: Payer: Self-pay | Admitting: Pediatrics

## 2021-09-10 ENCOUNTER — Ambulatory Visit
Admission: RE | Admit: 2021-09-10 | Discharge: 2021-09-10 | Disposition: A | Discharge status: Home / Self Care | Payer: Medicare Other | Source: Ambulatory Visit | Attending: Pediatrics | Admitting: Pediatrics

## 2021-09-10 ENCOUNTER — Other Ambulatory Visit: Payer: Self-pay

## 2021-09-10 DIAGNOSIS — R140 Abdominal distension (gaseous): Secondary | ICD-10-CM | POA: Insufficient documentation

## 2021-09-10 DIAGNOSIS — R1032 Left lower quadrant pain: Secondary | ICD-10-CM

## 2021-09-23 ENCOUNTER — Other Ambulatory Visit: Payer: Self-pay | Admitting: Family Medicine

## 2021-09-23 DIAGNOSIS — R221 Localized swelling, mass and lump, neck: Secondary | ICD-10-CM

## 2021-10-12 ENCOUNTER — Ambulatory Visit
Admission: RE | Admit: 2021-10-12 | Discharge: 2021-10-12 | Disposition: A | Discharge status: Home / Self Care | Payer: Medicare Other | Source: Ambulatory Visit | Attending: Family Medicine | Admitting: Family Medicine

## 2021-10-12 ENCOUNTER — Other Ambulatory Visit: Payer: Self-pay

## 2021-10-12 DIAGNOSIS — R221 Localized swelling, mass and lump, neck: Secondary | ICD-10-CM | POA: Insufficient documentation

## 2021-10-12 LAB — POCT I-STAT CREATININE: Creatinine, Ser: 1.1 mg/dL — ABNORMAL HIGH (ref 0.44–1.00)

## 2021-10-12 MED ORDER — IOHEXOL 300 MG/ML  SOLN
75.0000 mL | Freq: Once | INTRAMUSCULAR | Status: AC | PRN
  Administered 2021-10-12: 75 mL via INTRAVENOUS

## 2021-10-26 ENCOUNTER — Other Ambulatory Visit: Payer: Self-pay | Admitting: Family Medicine

## 2021-10-26 DIAGNOSIS — E041 Nontoxic single thyroid nodule: Secondary | ICD-10-CM

## 2021-11-08 ENCOUNTER — Other Ambulatory Visit: Payer: Self-pay

## 2021-11-08 ENCOUNTER — Ambulatory Visit
Admission: RE | Admit: 2021-11-08 | Discharge: 2021-11-08 | Disposition: A | Discharge status: Home / Self Care | Payer: Medicare Other | Source: Ambulatory Visit | Attending: Family Medicine | Admitting: Family Medicine

## 2021-11-08 DIAGNOSIS — E041 Nontoxic single thyroid nodule: Secondary | ICD-10-CM

## 2021-11-18 ENCOUNTER — Other Ambulatory Visit: Payer: Self-pay | Admitting: Otolaryngology

## 2021-11-24 ENCOUNTER — Ambulatory Visit
Admission: RE | Admit: 2021-11-24 | Discharge: 2021-11-24 | Disposition: A | Discharge status: Home / Self Care | Payer: Medicare Other | Source: Ambulatory Visit | Attending: Otolaryngology | Admitting: Otolaryngology

## 2021-11-24 DIAGNOSIS — E041 Nontoxic single thyroid nodule: Secondary | ICD-10-CM | POA: Insufficient documentation

## 2021-11-24 NOTE — Procedures (Signed)
Successful US guided FNA of the left thyroid nodule ?No complications. ?See PACS for full report. ? ?Tsosie Billing D, PA-C ?11/24/2021, 2:36 PM ? ? ? ? ?

## 2021-11-25 LAB — CYTOLOGY - NON PAP

## 2021-12-01 ENCOUNTER — Other Ambulatory Visit: Payer: Self-pay | Admitting: Otolaryngology

## 2021-12-01 DIAGNOSIS — E041 Nontoxic single thyroid nodule: Secondary | ICD-10-CM

## 2022-04-28 ENCOUNTER — Other Ambulatory Visit (HOSPITAL_COMMUNITY): Payer: Self-pay | Admitting: Internal Medicine

## 2022-05-02 ENCOUNTER — Other Ambulatory Visit: Payer: Self-pay | Admitting: Orthopedic Surgery

## 2022-05-02 DIAGNOSIS — Z96642 Presence of left artificial hip joint: Secondary | ICD-10-CM

## 2022-05-03 ENCOUNTER — Other Ambulatory Visit (HOSPITAL_COMMUNITY): Payer: Self-pay | Admitting: Internal Medicine

## 2022-05-03 DIAGNOSIS — E7800 Pure hypercholesterolemia, unspecified: Secondary | ICD-10-CM

## 2022-05-03 DIAGNOSIS — R0609 Other forms of dyspnea: Secondary | ICD-10-CM

## 2022-05-11 ENCOUNTER — Encounter
Admission: RE | Admit: 2022-05-11 | Discharge: 2022-05-11 | Disposition: A | Discharge status: Home / Self Care | Payer: Medicare Other | Source: Ambulatory Visit | Attending: Orthopedic Surgery | Admitting: Orthopedic Surgery

## 2022-05-11 DIAGNOSIS — Z96642 Presence of left artificial hip joint: Secondary | ICD-10-CM | POA: Insufficient documentation

## 2022-05-11 MED ORDER — TECHNETIUM TC 99M MEDRONATE IV KIT
20.0000 | PACK | Freq: Once | INTRAVENOUS | Status: AC | PRN
  Administered 2022-05-11: 20.93 via INTRAVENOUS

## 2022-05-16 ENCOUNTER — Other Ambulatory Visit: Payer: Self-pay | Admitting: Orthopedic Surgery

## 2022-05-16 ENCOUNTER — Ambulatory Visit
Admission: RE | Admit: 2022-05-16 | Discharge: 2022-05-16 | Disposition: A | Discharge status: Home / Self Care | Payer: Medicare Other | Source: Ambulatory Visit | Attending: Orthopedic Surgery | Admitting: Orthopedic Surgery

## 2022-05-16 DIAGNOSIS — Z96642 Presence of left artificial hip joint: Secondary | ICD-10-CM | POA: Diagnosis present

## 2022-05-16 LAB — POCT I-STAT CREATININE: Creatinine, Ser: 1.1 mg/dL — ABNORMAL HIGH (ref 0.44–1.00)

## 2022-05-16 MED ORDER — IOHEXOL 300 MG/ML  SOLN
100.0000 mL | Freq: Once | INTRAMUSCULAR | Status: AC | PRN
  Administered 2022-05-16: 100 mL via INTRAVENOUS

## 2022-05-17 ENCOUNTER — Other Ambulatory Visit
Admission: RE | Admit: 2022-05-17 | Discharge: 2022-05-17 | Disposition: A | Discharge status: Home / Self Care | Payer: Medicare Other | Source: Ambulatory Visit | Attending: Sports Medicine | Admitting: Sports Medicine

## 2022-05-17 DIAGNOSIS — Z96649 Presence of unspecified artificial hip joint: Secondary | ICD-10-CM | POA: Diagnosis present

## 2022-05-17 DIAGNOSIS — M25552 Pain in left hip: Secondary | ICD-10-CM | POA: Diagnosis present

## 2022-05-17 DIAGNOSIS — M1612 Unilateral primary osteoarthritis, left hip: Secondary | ICD-10-CM | POA: Diagnosis present

## 2022-05-17 DIAGNOSIS — T84038D Mechanical loosening of other internal prosthetic joint, subsequent encounter: Secondary | ICD-10-CM | POA: Diagnosis present

## 2022-05-17 DIAGNOSIS — M25452 Effusion, left hip: Secondary | ICD-10-CM | POA: Diagnosis present

## 2022-05-17 DIAGNOSIS — Z96642 Presence of left artificial hip joint: Secondary | ICD-10-CM | POA: Insufficient documentation

## 2022-05-17 LAB — SYNOVIAL FLUID, CRYSTAL: Crystals, Fluid: NONE SEEN

## 2022-05-18 ENCOUNTER — Telehealth (HOSPITAL_COMMUNITY): Payer: Self-pay | Admitting: *Deleted

## 2022-05-18 ENCOUNTER — Other Ambulatory Visit (HOSPITAL_COMMUNITY): Payer: Self-pay | Admitting: *Deleted

## 2022-05-18 MED ORDER — METOPROLOL TARTRATE 100 MG PO TABS: ORAL_TABLET | ORAL | 0 refills | Status: DC

## 2022-05-18 MED ORDER — IVABRADINE HCL 7.5 MG PO TABS: ORAL_TABLET | ORAL | 0 refills | Status: DC

## 2022-05-18 NOTE — Telephone Encounter (Signed)
Reaching out to patient to offer assistance regarding upcoming cardiac imaging study; pt verbalizes understanding of appt date/time, parking situation and where to check in, medications ordered, and verified current allergies; name and call back number provided for further questions should they arise  Gordy Clement RN Navigator Cardiac Imaging [facility] [phone] office [phone] cell  Patient to take '100mg'$  metoprolol tartrate and '15mg'$  ivabradine two hours prior cardiac CT scan.

## 2022-05-18 NOTE — Telephone Encounter (Signed)
Attempted to call patient regarding upcoming cardiac CT appointment. °Left message on voicemail with name and callback number ° °Daenerys Buttram RN Navigator Cardiac Imaging °[facility] °[phone] Office °[phone] Cell ° °

## 2022-05-19 ENCOUNTER — Ambulatory Visit
Admission: RE | Admit: 2022-05-19 | Discharge: 2022-05-19 | Disposition: A | Discharge status: Home / Self Care | Payer: Medicare Other | Source: Ambulatory Visit | Attending: Internal Medicine | Admitting: Internal Medicine

## 2022-05-19 DIAGNOSIS — R0609 Other forms of dyspnea: Secondary | ICD-10-CM | POA: Insufficient documentation

## 2022-05-19 DIAGNOSIS — E7800 Pure hypercholesterolemia, unspecified: Secondary | ICD-10-CM | POA: Diagnosis present

## 2022-05-19 DIAGNOSIS — I2510 Atherosclerotic heart disease of native coronary artery without angina pectoris: Secondary | ICD-10-CM | POA: Insufficient documentation

## 2022-05-19 MED ORDER — NITROGLYCERIN 0.4 MG SL SUBL
0.8000 mg | SUBLINGUAL_TABLET | Freq: Once | SUBLINGUAL | Status: AC
Start: 2022-05-19 — End: 2022-05-19
  Administered 2022-05-19: 0.8 mg via SUBLINGUAL

## 2022-05-19 MED ORDER — IOHEXOL 350 MG/ML SOLN
75.0000 mL | Freq: Once | INTRAVENOUS | Status: AC | PRN
  Administered 2022-05-19: 75 mL via INTRAVENOUS

## 2022-05-19 NOTE — Progress Notes (Signed)
Patient tolerated procedure well. Ambulate w/o difficulty. Denies light headedness or being dizzy. Sitting in chair drinking water provided. Encouraged to drink extra water today and reasoning explained. Verbalized understanding. All questions answered. ABC intact. No further needs. Discharge from procedure area w/o issues.   °

## 2022-05-20 DIAGNOSIS — I2510 Atherosclerotic heart disease of native coronary artery without angina pectoris: Secondary | ICD-10-CM | POA: Diagnosis present

## 2022-05-26 ENCOUNTER — Other Ambulatory Visit: Payer: Self-pay | Admitting: Orthopedic Surgery

## 2022-05-31 ENCOUNTER — Ambulatory Visit
Admission: EM | Admit: 2022-05-31 | Discharge: 2022-05-31 | Disposition: A | Discharge status: Home / Self Care | Payer: Medicare Other | Attending: Family Medicine | Admitting: Family Medicine

## 2022-05-31 DIAGNOSIS — H66002 Acute suppurative otitis media without spontaneous rupture of ear drum, left ear: Secondary | ICD-10-CM

## 2022-05-31 DIAGNOSIS — R42 Dizziness and giddiness: Secondary | ICD-10-CM | POA: Diagnosis not present

## 2022-05-31 MED ORDER — MECLIZINE HCL 12.5 MG PO TABS: 12.5000 mg | ORAL_TABLET | Freq: Three times a day (TID) | ORAL | 0 refills | Status: DC | PRN

## 2022-05-31 MED ORDER — AMOXICILLIN 875 MG PO TABS: 875.0000 mg | ORAL_TABLET | Freq: Two times a day (BID) | ORAL | 0 refills | Status: AC

## 2022-05-31 NOTE — ED Triage Notes (Signed)
Pt c/o Dizziness x3days  Pt believes that she has an ear infection. Pt denies any pain in her ears.

## 2022-05-31 NOTE — ED Provider Notes (Signed)
[facility]    CSN: [id number] Arrival date & time: 05/31/22  0827      History   Chief Complaint Chief Complaint  Patient presents with   Dizziness   Ear Problem         HPI Kayla Wade is a 71 y.o. female.   HPI  Kayla Wade presents for dizziness described as room spinning 3 days.  He has to hold onto furniture when she moves around.  She feels like she is going to fall over.  Similar symptoms when she had an ear infection in the past.  She did not have pain then and does not have pain now.  Says she has had a cough, nasal congestion.  Denies headache, numbness, focal weakness, tingling, fever, nausea, vomiting, diarrhea and neck pain.  She has been eating and drinking well.  There has been no chest pain or shortness of breath.  She is not taking any new medications.  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   Breast lump or mass 01/23/2013   DEPRESSIVE DISORDER 10/09/2008   SLEEP DISORDER 08/25/2008   INJURY, FINGER 04/28/2008   IMPAIRED FASTING GLUCOSE 01/07/2008   ARACHNOID CYST 11/27/2007   Dizziness and giddiness 08/20/2007   NEPHROLITHIASIS 07/09/2007   GOITER, 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   HYPERLIPIDEMIA 02/01/2007   COMMON MIGRAINE 02/01/2007   HYPERTENSION 02/01/2007   EDEMA 02/01/2007    Past Surgical History:  Procedure Laterality Date   ABDOMINAL HYSTERECTOMY     partial   BRAIN SURGERY  2010   due to cyst-done at [facility]-Pt has shunt    BREAST BIOPSY Left 01/22/2013   BENIGN BREAST TISSUE WITH FIBROCYSTIC CHANGES INCLUDING USUAL   BREAST SURGERY Right 1998   right breast biopsy   CHOLECYSTECTOMY     COLONOSCOPY  2006   [place], [place]   COLONOSCOPY WITH PROPOFOL N/A 04/11/2018   Procedure: COLONOSCOPY WITH PROPOFOL;  Surgeon: Robert Bellow, MD;  Location: [facility];  Service: Endoscopy;  Laterality: N/A;   ELBOW SURGERY Right    TONSILLECTOMY     TOTAL HIP ARTHROPLASTY Left 06/24/2021   Procedure: TOTAL HIP ARTHROPLASTY ANTERIOR APPROACH;  Surgeon: Hessie Knows, MD;  Location: [facility];  Service: Orthopedics;  Laterality: Left;   TUBAL LIGATION  1976    OB History     Gravida  3   Para  2   Term      Preterm      AB  1   Living  2      SAB  1   IAB      Ectopic      Multiple      Live Births           Obstetric Comments  1st Menstrual Cycle:  14 1st Pregnancy:  18  Home Medications    Prior to Admission medications   Medication Sig Start Date End Date Taking? Authorizing Provider  amoxicillin (AMOXIL) 875 MG tablet Take 1 tablet (875 mg total) by mouth 2 (two) times daily for 10 days. 05/31/22 06/10/22 Yes Tameah Mihalko, DO  atorvastatin (LIPITOR) 40 MG tablet Take 40 mg by mouth daily. 03/04/22  Yes [provider]  enalapril (VASOTEC) 20 MG tablet Take 2.5 mg by mouth 2 (two) times daily. 06/04/21  Yes [provider]  magnesium oxide (MAG-OX) 400 (240 Mg) MG tablet Take 800 mg by mouth 2 (two) times daily. 02/08/21  Yes [provider]  meclizine (ANTIVERT) 12.5 MG tablet Take 1 tablet (12.5 mg total) by mouth 3 (three) times daily as needed for dizziness. 05/31/22  Yes Mukund Weinreb, DO  metFORMIN (GLUCOPHAGE-XR) 500 MG 24 hr tablet Take 500 mg by mouth daily with breakfast.   Yes Tama High III, MD  metoprolol tartrate (LOPRESSOR) 100 MG tablet Take tablet ('100mg'$ ) TWO hours prior to your cardiac CT scan. 05/18/22  Yes  Agbor-Etang, Aaron Edelman, MD  Multiple Vitamins-Minerals (MULTIVITAMIN WITH MINERALS) tablet Take 1 tablet by mouth daily.   Yes [provider]  risperiDONE (RISPERDAL) 0.5 MG tablet Take 0.5 mg by mouth at bedtime.   Yes [provider]  SLOW-MAG 71.5-119 MG TBEC SR tablet Take 1 tablet by mouth daily. 04/27/21  Yes [provider]  spironolactone (ALDACTONE) 25 MG tablet Take 25 mg by mouth daily. 04/28/21  Yes [provider]  vitamin B-12 (CYANOCOBALAMIN) 1000 MCG tablet Take 2,000 mcg by mouth daily.   Yes [provider]  docusate sodium (COLACE) 100 MG capsule Take 1 capsule (100 mg total) by mouth 2 (two) times daily. 06/25/21   Duanne Guess, PA-C  enoxaparin (LOVENOX) 40 MG/0.4ML injection Inject 0.4 mLs (40 mg total) into the skin daily for 14 days. 06/26/21 07/10/21  Duanne Guess, PA-C  famotidine (PEPCID) 20 MG tablet Take 20 mg by mouth daily as needed for heartburn or indigestion. Patient not taking: Reported on 05/19/2022    [provider]  ivabradine (CORLANOR) 7.5 MG TABS tablet Take tablets ('15mg'$ ) TWO hours prior to your cardiac CT scan. 05/18/22   Kate Sable, MD  metFORMIN (GLUCOPHAGE-XR) 500 MG 24 hr tablet Take 500 mg by mouth 2 (two) times daily. 10/09/15 06/18/21  [provider]  methocarbamol (ROBAXIN) 500 MG tablet Take 1 tablet (500 mg total) by mouth every 6 (six) hours as needed for muscle spasms. Patient not taking: Reported on 05/19/2022 06/25/21   Duanne Guess, PA-C  oxyCODONE (OXY IR/ROXICODONE) 5 MG immediate release tablet Take 1-2 tablets (5-10 mg total) by mouth every 4 (four) hours as needed for moderate pain (pain score 4-6). Patient not taking: Reported on 05/19/2022 06/25/21   Duanne Guess, PA-C  polyethylene glycol (MIRALAX / GLYCOLAX) 17 g packet Take 17 g by mouth daily as needed for mild constipation. Patient not taking: Reported on 05/19/2022 06/25/21   Duanne Guess, PA-C   traMADol (ULTRAM) 50 MG tablet Take 1 tablet (50 mg total) by mouth every 6 (six) hours as needed. Patient not taking: Reported on 05/19/2022 06/25/21   Duanne Guess, PA-C    Family History Family History  Problem Relation Age of Onset   Lung cancer Sister    Breast cancer Neg Hx     Social History Social History   Tobacco Use   Smoking status: Never   Smokeless  tobacco: Former    Types: Chew    Quit date: 2010  Vaping Use   Vaping Use: Never used  Substance Use Topics   Alcohol use: No   Drug use: Never     Allergies   Tussionex pennkinetic er [hydrocod poli-chlorphe poli er], Zolpidem, and Erythromycin base   Review of Systems Review of Systems : negative unless otherwise stated in HPI.      Physical Exam Triage Vital Signs ED Triage Vitals  Enc Vitals Group     BP 05/31/22 0847 124/60     Pulse Rate 05/31/22 0847 62     Resp 05/31/22 0847 20     Temp 05/31/22 0847 98.2 F (36.8 C)     Temp Source 05/31/22 0847 Oral     SpO2 05/31/22 0847 95 %     Weight 05/31/22 0843 162 lb (73.5 kg)     Height 05/31/22 0843 '5\' 4"'$  (1.626 m)     Head Circumference --      Peak Flow --      Pain Score 05/31/22 0843 5     Pain Loc --      Pain Edu? --      Excl. in [place]? --    No data found.  Updated Vital Signs BP 124/60 (BP Location: Left Arm)   Pulse 62   Temp 98.2 F (36.8 C) (Oral)   Resp 20   Ht '5\' 4"'$  (1.626 m)   Wt 73.5 kg   SpO2 95%   BMI 27.81 kg/m   Visual Acuity Right Eye Distance:   Left Eye Distance:   Bilateral Distance:    Right Eye Near:   Left Eye Near:    Bilateral Near:     Physical Exam  GEN: alert, well appearing female, in no acute distress    HENT:  mucus membranes moist, oropharyngeal without lesions or erythema ,  nares patent, no nasal discharge, right TM erythematous but not bulging, left TM opaque and erythematous EYES:   pupils equal and reactive, EOM intact, no nystagmus  NECK:  supple, normal ROM, +goiter RESP:   clear to auscultation bilaterally, no increased work of breathing  CVS:   regular rate and rhythm EXT:   normal ROM, atraumatic, no LE edema, decreased ROM left hip (baseline per pt)  NEURO:  alert, oriented at baseline, speech normal, CN 2-12 grossly intact, no facial droop,  sensation grossly intact, normal coordination, normal finger to nose, normal heel to shin on right (unable to perform on left), positive Romberg  Skin:   warm and dry, no rash on visible skin, normal skin turgor Psych: Normal affect, appropriate speech and behavior    UC Treatments / Results  Labs (all labs ordered are listed, but only abnormal results are displayed) Labs Reviewed - No data to display  EKG   Radiology No results found.  Procedures Procedures (including critical care time)  Medications Ordered in UC Medications - No data to display  Initial Impression / Assessment and Plan / UC Course  I have reviewed the triage vital signs and the nursing notes.  Pertinent labs & imaging results that were available during my care of the patient were reviewed by me and considered in my medical decision making (see chart for details).     Pt is a 71 y.o. female with history of HTN, HLD, intraparenchymal cyst with shunt who presents for room spinning dizziness for the past 3 days.  She does not have a headache.  There has been no syncope.  I doubt acute cerebellar CVA. History not consistent with complex migraine. She does have upper respiratory symptoms to suggest labyrinthitis. No hearing loss to suggest Mnire's disease. No foreign bodies seen. She does have a goiter but reports normal thyroid ultrasound. Per chart review, thyroid US in March 2023 showed multinodular thyroid gland. I was unable to locate biopsy results but pt states it was normal.  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 She presented with dizziness at that visit. On exam, she has signs of vertigo and an acute ear infection.   We will treat with meclizine and antibiotics.  Showed patient how to perform Epley maneuver at home.  Handout also given.   Consider head imaging it pt returns. ED precautions reviewed and patient voiced understanding. She may need vestibular rehab.   Final Clinical Impressions(s) / UC Diagnoses   Final diagnoses:  Vertigo  Non-recurrent acute suppurative otitis media of left ear without spontaneous rupture of tympanic membrane     Discharge Instructions      Stop by the pharmacy to pick up your prescriptions.  Follow up with your primary care provider as needed.  See handout on vertigo.  Perform the exercises several times a day.     ED Prescriptions     Medication Sig Dispense Auth. Provider   amoxicillin (AMOXIL) 875 MG tablet Take 1 tablet (875 mg total) by mouth 2 (two) times daily for 10 days. 20 tablet Mariabelen Pressly, DO   meclizine (ANTIVERT) 12.5 MG tablet Take 1 tablet (12.5 mg total) by mouth 3 (three) times daily as needed for dizziness. 30 tablet Lyndee Hensen, DO      PDMP not reviewed this encounter.   Lyndee Hensen, DO 05/31/22 [id number]

## 2022-05-31 NOTE — Discharge Instructions (Addendum)
Stop by the pharmacy to pick up your prescriptions.  Follow up with your primary care provider as needed.  See handout on vertigo.  Perform the exercises several times a day. Consider vestibular rehab, if not improving.

## 2022-06-01 ENCOUNTER — Other Ambulatory Visit: Payer: Self-pay | Admitting: Orthopedic Surgery

## 2022-06-07 ENCOUNTER — Other Ambulatory Visit: Payer: Self-pay

## 2022-06-07 ENCOUNTER — Encounter
Admission: RE | Admit: 2022-06-07 | Discharge: 2022-06-07 | Disposition: A | Discharge status: Home / Self Care | Payer: Medicare Other | Source: Ambulatory Visit | Attending: Orthopedic Surgery | Admitting: Orthopedic Surgery

## 2022-06-07 VITALS — BP 157/70 | HR 83 | Resp 18 | Ht 64.0 in | Wt 160.5 lb

## 2022-06-07 DIAGNOSIS — R7989 Other specified abnormal findings of blood chemistry: Secondary | ICD-10-CM | POA: Insufficient documentation

## 2022-06-07 DIAGNOSIS — Z01810 Encounter for preprocedural cardiovascular examination: Secondary | ICD-10-CM | POA: Diagnosis not present

## 2022-06-07 DIAGNOSIS — Z01818 Encounter for other preprocedural examination: Secondary | ICD-10-CM | POA: Diagnosis present

## 2022-06-07 DIAGNOSIS — Z01812 Encounter for preprocedural laboratory examination: Secondary | ICD-10-CM

## 2022-06-07 HISTORY — DX: Anxiety disorder, unspecified: F41.9

## 2022-06-07 HISTORY — DX: Unspecified asthma, uncomplicated: J45.909

## 2022-06-07 HISTORY — DX: Depression, unspecified: F32.A

## 2022-06-07 LAB — SURGICAL PCR SCREEN
MRSA, PCR: NEGATIVE
Staphylococcus aureus: NEGATIVE

## 2022-06-07 LAB — TYPE AND SCREEN
ABO/RH(D): O POS
Antibody Screen: NEGATIVE

## 2022-06-07 LAB — COMPREHENSIVE METABOLIC PANEL
ALT: 73 U/L — ABNORMAL HIGH (ref 0–44)
AST: 84 U/L — ABNORMAL HIGH (ref 15–41)
Albumin: 3.9 g/dL (ref 3.5–5.0)
Alkaline Phosphatase: 99 U/L (ref 38–126)
Anion gap: 9 (ref 5–15)
BUN: 21 mg/dL (ref 8–23)
CO2: 24 mmol/L (ref 22–32)
Calcium: 9.6 mg/dL (ref 8.9–10.3)
Chloride: 103 mmol/L (ref 98–111)
Creatinine, Ser: 1.13 mg/dL — ABNORMAL HIGH (ref 0.44–1.00)
GFR, Estimated: 53 mL/min — ABNORMAL LOW (ref >60)
Glucose, Bld: 188 mg/dL — ABNORMAL HIGH (ref 70–99)
Potassium: 3.8 mmol/L (ref 3.5–5.1)
Sodium: 136 mmol/L (ref 135–145)
Total Bilirubin: 1.1 mg/dL (ref 0.3–1.2)
Total Protein: 8 g/dL (ref 6.5–8.1)

## 2022-06-07 LAB — URINALYSIS, ROUTINE W REFLEX MICROSCOPIC
Bilirubin Urine: NEGATIVE
Glucose, UA: 50 mg/dL — AB
Hgb urine dipstick: NEGATIVE
Ketones, ur: NEGATIVE mg/dL
Leukocytes,Ua: NEGATIVE
Nitrite: NEGATIVE
Protein, ur: NEGATIVE mg/dL
Specific Gravity, Urine: 1.016 (ref 1.005–1.030)
pH: 5 (ref 5.0–8.0)

## 2022-06-07 LAB — HEMOGLOBIN A1C
Hgb A1c MFr Bld: 7.9 % — ABNORMAL HIGH (ref 4.8–5.6)
Mean Plasma Glucose: 180.03 mg/dL

## 2022-06-07 LAB — CBC WITH DIFFERENTIAL/PLATELET
Abs Immature Granulocytes: 0.07 10*3/uL (ref 0.00–0.07)
Basophils Absolute: 0.1 10*3/uL (ref 0.0–0.1)
Basophils Relative: 1 %
Eosinophils Absolute: 0.2 10*3/uL (ref 0.0–0.5)
Eosinophils Relative: 1 %
HCT: 38.4 % (ref 36.0–46.0)
Hemoglobin: 12.7 g/dL (ref 12.0–15.0)
Immature Granulocytes: 1 %
Lymphocytes Relative: 27 %
Lymphs Abs: 2.9 10*3/uL (ref 0.7–4.0)
MCH: 28.5 pg (ref 26.0–34.0)
MCHC: 33.1 g/dL (ref 30.0–36.0)
MCV: 86.3 fL (ref 80.0–100.0)
Monocytes Absolute: 0.9 10*3/uL (ref 0.1–1.0)
Monocytes Relative: 8 %
Neutro Abs: 6.8 10*3/uL (ref 1.7–7.7)
Neutrophils Relative %: 62 %
Platelets: 315 10*3/uL (ref 150–400)
RBC: 4.45 MIL/uL (ref 3.87–5.11)
RDW: 11.8 % (ref 11.5–15.5)
WBC: 10.9 10*3/uL — ABNORMAL HIGH (ref 4.0–10.5)
nRBC: 0 % (ref 0.0–0.2)

## 2022-06-07 NOTE — Patient Instructions (Addendum)
Your procedure is scheduled on: 06/16/22 - THURSDAY Report to the Registration Desk on the 1st floor of the [facility]. To find out your arrival time, please call [phone] between 1PM - 3PM on: 06/15/22 [facility] If your arrival time is 6:00 am, do not arrive prior to that time as the [place] entrance doors do not open until 6:00 am.  REMEMBER: Instructions that are not followed completely may result in serious medical risk, up to and including death; or upon the discretion of your surgeon and anesthesiologist your surgery may need to be rescheduled.  Do not eat food after midnight the night before surgery.  No gum chewing, lozengers or hard candies.  You may however, drink CLEAR liquids up to 2 hours before you are scheduled to arrive for your surgery. Do not drink anything within 2 hours of your scheduled arrival time. Type 1 and Type 2 diabetics should only drink water.  In addition, your doctor has ordered for you to drink the provided  Gatorade G2 Drinking this carbohydrate drink up to two hours before surgery helps to reduce insulin resistance and improve patient outcomes. Please complete drinking 2 hours prior to scheduled arrival time.  TAKE ONLY THESE MEDICATIONS THE MORNING OF SURGERY WITH A SIP OF WATER:  - Fluticasone Propionate - magnesium oxide (MAG-OX)  - SLOW-MAG  - atorvastatin (LIPITOR)  - famotidine (PEPCID) 10 MG tablet - TAKE 1 THE NIGHT BEFORE SURGERY AND 1 THE MORNING OF SURGERY.  HOLD metFORMIN (GLUCOPHAGE-XR) BEGINNING 06/14/22.  One week prior to surgery: Stop Anti-inflammatories (NSAIDS) such as Advil, Aleve, Ibuprofen, Motrin, Naproxen, Naprosyn and Aspirin based products such as Excedrin, Goodys Powder, BC Powder.  Stop BEGINNING 06/09/22 ANY OVER THE COUNTER supplements until after surgery - Multiple Vitamins-Minerals   You may however, continue to take Tylenol if needed for pain up until the day of surgery.  No Alcohol for 24 hours before  or after surgery.  No Smoking including e-cigarettes for 24 hours prior to surgery.  No chewable tobacco products for at least 6 hours prior to surgery.  No nicotine patches on the day of surgery.  Do not use any "recreational" drugs for at least a week prior to your surgery.  Please be advised that the combination of cocaine and anesthesia may have negative outcomes, up to and including death. If you test positive for cocaine, your surgery will be cancelled.  On the morning of surgery brush your teeth with toothpaste and water, you may rinse your mouth with mouthwash if you wish. Do not swallow any toothpaste or mouthwash.  Use CHG Soap or wipes as directed on instruction sheet.  Do not wear jewelry, make-up, hairpins, clips or nail polish.  Do not wear lotions, powders, or perfumes.   Do not shave body from the neck down 48 hours prior to surgery just in case you cut yourself which could leave a site for infection.  Also, freshly shaved skin may become irritated if using the CHG soap.  Contact lenses, hearing aids and dentures may not be worn into surgery.  Do not bring valuables to the hospital. [facility] is not responsible for any missing/lost belongings or valuables.   Notify your doctor if there is any change in your medical condition (cold, fever, infection).  Wear comfortable clothing (specific to your surgery type) to the hospital.  After surgery, you can help prevent lung complications by doing breathing exercises.  Take deep breaths and cough every 1-2 hours. Your doctor  may order a device called an Incentive Spirometer to help you take deep breaths. When coughing or sneezing, hold a pillow firmly against your incision with both hands. This is called "splinting." Doing this helps protect your incision. It also decreases belly discomfort.  If you are being admitted to the hospital overnight, leave your suitcase in the car. After surgery it may be brought to your  room.  If you are being discharged the day of surgery, you will not be allowed to drive home. You will need a responsible adult (18 years or older) to drive you home and stay with you that night.   If you are taking public transportation, you will need to have a responsible adult (18 years or older) with you. Please confirm with your physician that it is acceptable to use public transportation.   Please call the [place] [facility] at [phone] if you have any questions about these instructions.  Surgery Visitation Policy:  Patients undergoing a surgery or procedure may have two family members or support persons with them as long as the person is not COVID-19 positive or experiencing its symptoms.   Inpatient Visitation:    Visiting hours are 7 a.m. to 8 p.m. Up to four visitors are allowed at one time in a patient room, including children. The visitors may rotate out with other people during the day. One designated support person (adult) may remain overnight.

## 2022-06-16 ENCOUNTER — Other Ambulatory Visit: Payer: Self-pay

## 2022-06-16 ENCOUNTER — Inpatient Hospital Stay: Payer: Medicare Other

## 2022-06-16 ENCOUNTER — Inpatient Hospital Stay: Payer: Medicare Other | Admitting: Urgent Care

## 2022-06-16 ENCOUNTER — Encounter: Payer: Self-pay | Admitting: *Deleted

## 2022-06-16 ENCOUNTER — Encounter
Admission: RE | Disposition: A | Discharge status: Home Health | Payer: Self-pay | Source: Home / Self Care | Attending: Orthopedic Surgery

## 2022-06-16 ENCOUNTER — Inpatient Hospital Stay
Admission: RE | Admit: 2022-06-16 | Discharge: 2022-06-17 | DRG: 467 | Disposition: A | Discharge status: Home Health | Payer: Medicare Other | Attending: Orthopedic Surgery | Admitting: Orthopedic Surgery

## 2022-06-16 DIAGNOSIS — Z8249 Family history of ischemic heart disease and other diseases of the circulatory system: Secondary | ICD-10-CM | POA: Diagnosis not present

## 2022-06-16 DIAGNOSIS — K219 Gastro-esophageal reflux disease without esophagitis: Secondary | ICD-10-CM | POA: Diagnosis present

## 2022-06-16 DIAGNOSIS — E1122 Type 2 diabetes mellitus with diabetic chronic kidney disease: Secondary | ICD-10-CM | POA: Diagnosis present

## 2022-06-16 DIAGNOSIS — Z882 Allergy status to sulfonamides status: Secondary | ICD-10-CM

## 2022-06-16 DIAGNOSIS — Z01812 Encounter for preprocedural laboratory examination: Secondary | ICD-10-CM

## 2022-06-16 DIAGNOSIS — F319 Bipolar disorder, unspecified: Secondary | ICD-10-CM | POA: Diagnosis present

## 2022-06-16 DIAGNOSIS — J45909 Unspecified asthma, uncomplicated: Secondary | ICD-10-CM | POA: Diagnosis present

## 2022-06-16 DIAGNOSIS — Z888 Allergy status to other drugs, medicaments and biological substances status: Secondary | ICD-10-CM

## 2022-06-16 DIAGNOSIS — Z885 Allergy status to narcotic agent status: Secondary | ICD-10-CM | POA: Diagnosis not present

## 2022-06-16 DIAGNOSIS — N1831 Chronic kidney disease, stage 3a: Secondary | ICD-10-CM | POA: Diagnosis present

## 2022-06-16 DIAGNOSIS — I2510 Atherosclerotic heart disease of native coronary artery without angina pectoris: Secondary | ICD-10-CM | POA: Diagnosis present

## 2022-06-16 DIAGNOSIS — E7800 Pure hypercholesterolemia, unspecified: Secondary | ICD-10-CM | POA: Diagnosis present

## 2022-06-16 DIAGNOSIS — Z833 Family history of diabetes mellitus: Secondary | ICD-10-CM

## 2022-06-16 DIAGNOSIS — E1165 Type 2 diabetes mellitus with hyperglycemia: Secondary | ICD-10-CM | POA: Diagnosis present

## 2022-06-16 DIAGNOSIS — T84031A Mechanical loosening of internal left hip prosthetic joint, initial encounter: Principal | ICD-10-CM | POA: Diagnosis present

## 2022-06-16 DIAGNOSIS — Z79899 Other long term (current) drug therapy: Secondary | ICD-10-CM

## 2022-06-16 DIAGNOSIS — D62 Acute posthemorrhagic anemia: Secondary | ICD-10-CM | POA: Diagnosis not present

## 2022-06-16 DIAGNOSIS — I10 Essential (primary) hypertension: Secondary | ICD-10-CM

## 2022-06-16 DIAGNOSIS — Z881 Allergy status to other antibiotic agents status: Secondary | ICD-10-CM

## 2022-06-16 DIAGNOSIS — Z7984 Long term (current) use of oral hypoglycemic drugs: Secondary | ICD-10-CM

## 2022-06-16 DIAGNOSIS — F32A Depression, unspecified: Secondary | ICD-10-CM

## 2022-06-16 DIAGNOSIS — Y792 Prosthetic and other implants, materials and accessory orthopedic devices associated with adverse incidents: Secondary | ICD-10-CM | POA: Diagnosis present

## 2022-06-16 DIAGNOSIS — Z96649 Presence of unspecified artificial hip joint: Secondary | ICD-10-CM | POA: Diagnosis not present

## 2022-06-16 DIAGNOSIS — I129 Hypertensive chronic kidney disease with stage 1 through stage 4 chronic kidney disease, or unspecified chronic kidney disease: Secondary | ICD-10-CM | POA: Diagnosis present

## 2022-06-16 DIAGNOSIS — T84038A Mechanical loosening of other internal prosthetic joint, initial encounter: Secondary | ICD-10-CM | POA: Diagnosis present

## 2022-06-16 DIAGNOSIS — E785 Hyperlipidemia, unspecified: Secondary | ICD-10-CM | POA: Diagnosis present

## 2022-06-16 HISTORY — PX: ACETABULAR REVISION: SHX5712

## 2022-06-16 LAB — GLUCOSE, CAPILLARY
Glucose-Capillary: 193 mg/dL — ABNORMAL HIGH (ref 70–99)
Glucose-Capillary: 244 mg/dL — ABNORMAL HIGH (ref 70–99)
Glucose-Capillary: 398 mg/dL — ABNORMAL HIGH (ref 70–99)
Glucose-Capillary: 447 mg/dL — ABNORMAL HIGH (ref 70–99)
Glucose-Capillary: 450 mg/dL — ABNORMAL HIGH (ref 70–99)
Glucose-Capillary: 351 mg/dL — ABNORMAL HIGH (ref 70–99)

## 2022-06-16 LAB — BASIC METABOLIC PANEL
Anion gap: 10 (ref 5–15)
BUN: 18 mg/dL (ref 8–23)
CO2: 18 mmol/L — ABNORMAL LOW (ref 22–32)
Calcium: 7.8 mg/dL — ABNORMAL LOW (ref 8.9–10.3)
Chloride: 102 mmol/L (ref 98–111)
Creatinine, Ser: 1.23 mg/dL — ABNORMAL HIGH (ref 0.44–1.00)
GFR, Estimated: 48 mL/min — ABNORMAL LOW (ref >60)
Glucose, Bld: 422 mg/dL — ABNORMAL HIGH (ref 70–99)
Potassium: 4.5 mmol/L (ref 3.5–5.1)
Sodium: 130 mmol/L — ABNORMAL LOW (ref 135–145)

## 2022-06-16 SURGERY — REVISION, TOTAL ARTHROPLASTY, HIP, ACETABULAR COMPONENT
Anesthesia: General | Site: Hip | Laterality: Left

## 2022-06-16 MED ORDER — INSULIN ASPART 100 UNIT/ML IJ SOLN
INTRAMUSCULAR | Status: AC
  Filled 2022-06-16: qty 1

## 2022-06-16 MED ORDER — ENALAPRIL MALEATE 5 MG PO TABS
5.0000 mg | ORAL_TABLET | Freq: Two times a day (BID) | ORAL | Status: DC
  Administered 2022-06-16: 5 mg via ORAL
  Filled 2022-06-16 (×2): qty 1

## 2022-06-16 MED ORDER — FENTANYL CITRATE (PF) 100 MCG/2ML IJ SOLN
INTRAMUSCULAR | Status: DC | PRN
  Administered 2022-06-16 (×2): 50 ug via INTRAVENOUS

## 2022-06-16 MED ORDER — LIDOCAINE HCL (PF) 2 % IJ SOLN
INTRAMUSCULAR | Status: AC
  Filled 2022-06-16: qty 5

## 2022-06-16 MED ORDER — CHLORHEXIDINE GLUCONATE 0.12 % MT SOLN: 15.0000 mL | Freq: Once | OROMUCOSAL | Status: AC

## 2022-06-16 MED ORDER — DOCUSATE SODIUM 100 MG PO CAPS
ORAL_CAPSULE | ORAL | Status: AC
  Administered 2022-06-16: 100 mg via ORAL
  Filled 2022-06-16: qty 1

## 2022-06-16 MED ORDER — ONDANSETRON HCL 4 MG/2ML IJ SOLN: 4.0000 mg | Freq: Four times a day (QID) | INTRAMUSCULAR | Status: DC | PRN

## 2022-06-16 MED ORDER — ONDANSETRON HCL 4 MG PO TABS: 4.0000 mg | ORAL_TABLET | Freq: Four times a day (QID) | ORAL | Status: DC | PRN

## 2022-06-16 MED ORDER — SURGIPHOR WOUND IRRIGATION SYSTEM - OPTIME: TOPICAL | Status: DC | PRN

## 2022-06-16 MED ORDER — CHLORHEXIDINE GLUCONATE 0.12 % MT SOLN
OROMUCOSAL | Status: AC
  Administered 2022-06-16: 15 mL via OROMUCOSAL
  Filled 2022-06-16: qty 15

## 2022-06-16 MED ORDER — SODIUM CHLORIDE FLUSH 0.9 % IV SOLN
INTRAVENOUS | Status: AC
  Filled 2022-06-16: qty 20

## 2022-06-16 MED ORDER — ATORVASTATIN CALCIUM 20 MG PO TABS
40.0000 mg | ORAL_TABLET | Freq: Every day | ORAL | Status: DC
  Filled 2022-06-16: qty 2

## 2022-06-16 MED ORDER — ACETAMINOPHEN 500 MG PO TABS
1000.0000 mg | ORAL_TABLET | Freq: Three times a day (TID) | ORAL | Status: DC
  Administered 2022-06-16: 1000 mg via ORAL

## 2022-06-16 MED ORDER — FENTANYL CITRATE (PF) 100 MCG/2ML IJ SOLN
25.0000 ug | INTRAMUSCULAR | Status: DC | PRN
  Administered 2022-06-16: 25 ug via INTRAVENOUS

## 2022-06-16 MED ORDER — CEFAZOLIN SODIUM-DEXTROSE 2-4 GM/100ML-% IV SOLN
2.0000 g | INTRAVENOUS | Status: AC
  Administered 2022-06-16: 2 g via INTRAVENOUS

## 2022-06-16 MED ORDER — ORAL CARE MOUTH RINSE: 15.0000 mL | Freq: Once | OROMUCOSAL | Status: AC

## 2022-06-16 MED ORDER — HYDROMORPHONE HCL 1 MG/ML IJ SOLN
INTRAMUSCULAR | Status: AC
  Filled 2022-06-16: qty 1

## 2022-06-16 MED ORDER — FENTANYL CITRATE (PF) 100 MCG/2ML IJ SOLN
INTRAMUSCULAR | Status: AC
  Filled 2022-06-16: qty 2

## 2022-06-16 MED ORDER — SODIUM CHLORIDE 0.9 % IV BOLUS
1000.0000 mL | Freq: Once | INTRAVENOUS | Status: AC
  Administered 2022-06-16: 1000 mL via INTRAVENOUS

## 2022-06-16 MED ORDER — PROPOFOL 10 MG/ML IV BOLUS
INTRAVENOUS | Status: AC
  Filled 2022-06-16: qty 20

## 2022-06-16 MED ORDER — PHENOL 1.4 % MT LIQD: 1.0000 | OROMUCOSAL | Status: DC | PRN

## 2022-06-16 MED ORDER — MIDAZOLAM HCL 2 MG/2ML IJ SOLN
INTRAMUSCULAR | Status: DC | PRN
  Administered 2022-06-16: 2 mg via INTRAVENOUS

## 2022-06-16 MED ORDER — TRANEXAMIC ACID-NACL 1000-0.7 MG/100ML-% IV SOLN
INTRAVENOUS | Status: DC | PRN
  Administered 2022-06-16 (×2): 1000 mg via INTRAVENOUS

## 2022-06-16 MED ORDER — PROPOFOL 10 MG/ML IV BOLUS
INTRAVENOUS | Status: DC | PRN
  Administered 2022-06-16: 120 mg via INTRAVENOUS

## 2022-06-16 MED ORDER — BUPIVACAINE-EPINEPHRINE (PF) 0.25% -1:200000 IJ SOLN
INTRAMUSCULAR | Status: AC
  Filled 2022-06-16: qty 30

## 2022-06-16 MED ORDER — DOCUSATE SODIUM 100 MG PO CAPS: 100.0000 mg | ORAL_CAPSULE | Freq: Two times a day (BID) | ORAL | Status: DC

## 2022-06-16 MED ORDER — MENTHOL 3 MG MT LOZG: 1.0000 | LOZENGE | OROMUCOSAL | Status: DC | PRN

## 2022-06-16 MED ORDER — LIDOCAINE HCL (CARDIAC) PF 100 MG/5ML IV SOSY
PREFILLED_SYRINGE | INTRAVENOUS | Status: DC | PRN
  Administered 2022-06-16: 100 mg via INTRAVENOUS

## 2022-06-16 MED ORDER — MORPHINE SULFATE (PF) 4 MG/ML IV SOLN: 0.5000 mg | INTRAVENOUS | Status: DC | PRN

## 2022-06-16 MED ORDER — SODIUM CHLORIDE (PF) 0.9 % IJ SOLN
INTRAMUSCULAR | Status: DC | PRN
  Administered 2022-06-16: 70 mL via INTRAMUSCULAR

## 2022-06-16 MED ORDER — PANTOPRAZOLE SODIUM 40 MG PO TBEC: 40.0000 mg | DELAYED_RELEASE_TABLET | Freq: Every day | ORAL | Status: DC

## 2022-06-16 MED ORDER — VANCOMYCIN HCL 1000 MG IV SOLR
INTRAVENOUS | Status: AC
  Filled 2022-06-16: qty 40

## 2022-06-16 MED ORDER — HYDROMORPHONE HCL 1 MG/ML IJ SOLN
INTRAMUSCULAR | Status: DC | PRN
  Administered 2022-06-16 (×2): .5 mg via INTRAVENOUS

## 2022-06-16 MED ORDER — MIDAZOLAM HCL 2 MG/2ML IJ SOLN
INTRAMUSCULAR | Status: AC
  Filled 2022-06-16: qty 2

## 2022-06-16 MED ORDER — INSULIN ASPART 100 UNIT/ML IJ SOLN
0.0000 [IU] | Freq: Three times a day (TID) | INTRAMUSCULAR | Status: DC
  Administered 2022-06-16: 15 [IU] via SUBCUTANEOUS

## 2022-06-16 MED ORDER — CEFAZOLIN SODIUM-DEXTROSE 2-4 GM/100ML-% IV SOLN
INTRAVENOUS | Status: AC
  Filled 2022-06-16: qty 100

## 2022-06-16 MED ORDER — CEFAZOLIN SODIUM-DEXTROSE 2-4 GM/100ML-% IV SOLN
2.0000 g | Freq: Four times a day (QID) | INTRAVENOUS | Status: AC
  Administered 2022-06-16: 2 g via INTRAVENOUS
  Filled 2022-06-16 (×2): qty 100

## 2022-06-16 MED ORDER — CEFAZOLIN SODIUM-DEXTROSE 2-4 GM/100ML-% IV SOLN
INTRAVENOUS | Status: AC
  Administered 2022-06-16: 2 g via INTRAVENOUS
  Filled 2022-06-16: qty 100

## 2022-06-16 MED ORDER — ALBUTEROL SULFATE (2.5 MG/3ML) 0.083% IN NEBU: 2.5000 mg | INHALATION_SOLUTION | Freq: Four times a day (QID) | RESPIRATORY_TRACT | Status: DC | PRN

## 2022-06-16 MED ORDER — HYDROCHLOROTHIAZIDE 12.5 MG PO TABS
12.5000 mg | ORAL_TABLET | Freq: Every day | ORAL | Status: DC
  Filled 2022-06-16: qty 1

## 2022-06-16 MED ORDER — SUGAMMADEX SODIUM 200 MG/2ML IV SOLN
INTRAVENOUS | Status: DC | PRN
  Administered 2022-06-16: 100 mg via INTRAVENOUS
  Administered 2022-06-16 (×2): 50 mg via INTRAVENOUS

## 2022-06-16 MED ORDER — TRANEXAMIC ACID 1000 MG/10ML IV SOLN
INTRAVENOUS | Status: AC
  Filled 2022-06-16: qty 20

## 2022-06-16 MED ORDER — FAMOTIDINE 20 MG PO TABS
ORAL_TABLET | ORAL | Status: AC
  Filled 2022-06-16: qty 1

## 2022-06-16 MED ORDER — ENOXAPARIN SODIUM 40 MG/0.4ML IJ SOSY: 40.0000 mg | PREFILLED_SYRINGE | INTRAMUSCULAR | Status: DC

## 2022-06-16 MED ORDER — KETOROLAC TROMETHAMINE 15 MG/ML IJ SOLN
7.5000 mg | Freq: Four times a day (QID) | INTRAMUSCULAR | Status: DC
  Administered 2022-06-16 (×2): 7.5 mg via INTRAVENOUS

## 2022-06-16 MED ORDER — RISPERIDONE 0.5 MG PO TABS
0.5000 mg | ORAL_TABLET | Freq: Every day | ORAL | Status: DC
  Administered 2022-06-16: 0.5 mg via ORAL
  Filled 2022-06-16: qty 1

## 2022-06-16 MED ORDER — HYDROCODONE-ACETAMINOPHEN 5-325 MG PO TABS: 1.0000 | ORAL_TABLET | ORAL | Status: DC | PRN

## 2022-06-16 MED ORDER — INSULIN ASPART 100 UNIT/ML IJ SOLN
INTRAMUSCULAR | Status: AC
  Administered 2022-06-16: 5 [IU] via SUBCUTANEOUS
  Filled 2022-06-16: qty 1

## 2022-06-16 MED ORDER — DEXAMETHASONE SODIUM PHOSPHATE 10 MG/ML IJ SOLN
INTRAMUSCULAR | Status: DC | PRN
  Administered 2022-06-16: 10 mg via INTRAVENOUS

## 2022-06-16 MED ORDER — KETOROLAC TROMETHAMINE 15 MG/ML IJ SOLN
INTRAMUSCULAR | Status: AC
  Filled 2022-06-16: qty 1

## 2022-06-16 MED ORDER — PROPOFOL 1000 MG/100ML IV EMUL
INTRAVENOUS | Status: AC
  Filled 2022-06-16: qty 100

## 2022-06-16 MED ORDER — OXYCODONE HCL 5 MG/5ML PO SOLN: 5.0000 mg | Freq: Once | ORAL | Status: DC | PRN

## 2022-06-16 MED ORDER — SODIUM CHLORIDE 0.9 % IR SOLN
Status: DC | PRN
  Administered 2022-06-16: 100 mL
  Administered 2022-06-16: 3000 mL

## 2022-06-16 MED ORDER — BUPIVACAINE LIPOSOME 1.3 % IJ SUSP
INTRAMUSCULAR | Status: AC
  Filled 2022-06-16: qty 20

## 2022-06-16 MED ORDER — INSULIN ASPART 100 UNIT/ML IJ SOLN: 0.0000 [IU] | Freq: Every day | INTRAMUSCULAR | Status: DC

## 2022-06-16 MED ORDER — ACETAMINOPHEN 500 MG PO TABS
ORAL_TABLET | ORAL | Status: AC
  Administered 2022-06-16: 1000 mg via ORAL
  Filled 2022-06-16: qty 2

## 2022-06-16 MED ORDER — PHENYLEPHRINE HCL-NACL 20-0.9 MG/250ML-% IV SOLN
INTRAVENOUS | Status: AC
  Filled 2022-06-16: qty 250

## 2022-06-16 MED ORDER — METOCLOPRAMIDE HCL 5 MG/ML IJ SOLN: 5.0000 mg | Freq: Three times a day (TID) | INTRAMUSCULAR | Status: DC | PRN

## 2022-06-16 MED ORDER — OXYCODONE HCL 5 MG PO TABS: 5.0000 mg | ORAL_TABLET | Freq: Once | ORAL | Status: DC | PRN

## 2022-06-16 MED ORDER — ACETAMINOPHEN 500 MG PO TABS
ORAL_TABLET | ORAL | Status: AC
  Filled 2022-06-16: qty 2

## 2022-06-16 MED ORDER — ONDANSETRON HCL 4 MG/2ML IJ SOLN
INTRAMUSCULAR | Status: AC
  Filled 2022-06-16: qty 2

## 2022-06-16 MED ORDER — ALBUTEROL SULFATE HFA 108 (90 BASE) MCG/ACT IN AERS: 2.0000 | INHALATION_SPRAY | Freq: Four times a day (QID) | RESPIRATORY_TRACT | Status: DC | PRN

## 2022-06-16 MED ORDER — ROCURONIUM BROMIDE 100 MG/10ML IV SOLN
INTRAVENOUS | Status: DC | PRN
  Administered 2022-06-16: 20 mg via INTRAVENOUS
  Administered 2022-06-16: 40 mg via INTRAVENOUS
  Administered 2022-06-16: 10 mg via INTRAVENOUS
  Administered 2022-06-16: 20 mg via INTRAVENOUS

## 2022-06-16 MED ORDER — PANTOPRAZOLE SODIUM 40 MG PO TBEC
DELAYED_RELEASE_TABLET | ORAL | Status: AC
  Administered 2022-06-16: 40 mg via ORAL
  Filled 2022-06-16: qty 1

## 2022-06-16 MED ORDER — SODIUM CHLORIDE 0.9 % IV SOLN: INTRAVENOUS | Status: DC

## 2022-06-16 MED ORDER — TRAMADOL HCL 50 MG PO TABS: 50.0000 mg | ORAL_TABLET | Freq: Four times a day (QID) | ORAL | Status: DC | PRN

## 2022-06-16 MED ORDER — ONDANSETRON HCL 4 MG/2ML IJ SOLN
INTRAMUSCULAR | Status: DC | PRN
  Administered 2022-06-16: 4 mg via INTRAVENOUS

## 2022-06-16 MED ORDER — PROPOFOL 500 MG/50ML IV EMUL
INTRAVENOUS | Status: DC | PRN
  Administered 2022-06-16: 165 ug/kg/min via INTRAVENOUS

## 2022-06-16 MED ORDER — METOCLOPRAMIDE HCL 10 MG PO TABS: 5.0000 mg | ORAL_TABLET | Freq: Three times a day (TID) | ORAL | Status: DC | PRN

## 2022-06-16 SURGICAL SUPPLY — 91 items
ADH SKN CLS APL DERMABOND .7 (GAUZE/BANDAGES/DRESSINGS) ×1
APL PRP STRL LF DISP 70% ISPRP (MISCELLANEOUS) ×2
BLADE SAGITTAL AGGR TOOTH XLG (BLADE) ×2 IMPLANT
BLADE SURG 10 STRL SS SAFETY (BLADE) ×2 IMPLANT
BNDG CMPR 5X6 CHSV STRCH STRL (GAUZE/BANDAGES/DRESSINGS)
BNDG COHESIVE 6X5 TAN ST LF (GAUZE/BANDAGES/DRESSINGS) ×2 IMPLANT
CHLORAPREP W/TINT 26 (MISCELLANEOUS) ×4 IMPLANT
COVER BACK TABLE REUSABLE LG (DRAPES) ×2 IMPLANT
DERMABOND ADVANCED .7 DNX12 (GAUZE/BANDAGES/DRESSINGS) ×2 IMPLANT
DRAPE 3/4 80X56 (DRAPES) ×2 IMPLANT
DRAPE IMP U-DRAPE 54X76 (DRAPES) ×2 IMPLANT
DRAPE INCISE 23X17 IOBAN STRL (DRAPES)
DRAPE INCISE 23X17 STRL (DRAPES) ×2 IMPLANT
DRAPE INCISE IOBAN 23X17 STRL (DRAPES) IMPLANT
DRAPE INCISE IOBAN 66X60 STRL (DRAPES) ×4 IMPLANT
DRAPE POUCH INSTRU U-SHP 10X18 (DRAPES) ×2 IMPLANT
DRSG AQUACEL AG ADV 3.5X14 (GAUZE/BANDAGES/DRESSINGS) IMPLANT
DRSG MEPILEX SACRM 8.7X9.8 (GAUZE/BANDAGES/DRESSINGS) ×2 IMPLANT
DRSG OPSITE POSTOP 4X10 (GAUZE/BANDAGES/DRESSINGS) IMPLANT
DRSG OPSITE POSTOP 4X12 (GAUZE/BANDAGES/DRESSINGS) IMPLANT
ELECT BLADE 6.5 EXT (BLADE) ×2 IMPLANT
ELECT REM PT RETURN 9FT ADLT (ELECTROSURGICAL) ×1
ELECTRODE REM PT RTRN 9FT ADLT (ELECTROSURGICAL) ×2 IMPLANT
GAUZE PACK 2X3YD (PACKING) ×2 IMPLANT
GLOVE PI ORTHO PRO STRL 7.5 (GLOVE) ×2 IMPLANT
GLOVE SURG ORTHO 8.0 STRL STRW (GLOVE) ×2 IMPLANT
GLOVE SURG SYN 7.5  E (GLOVE) ×2
GLOVE SURG SYN 7.5 E (GLOVE) ×2 IMPLANT
GLOVE SURG SYN 7.5 PF PI (GLOVE) ×4 IMPLANT
GLOVE SURG SYN 8.0 (GLOVE) ×2 IMPLANT
GLOVE SURG SYN 8.0 PF PI (GLOVE) ×4 IMPLANT
GLOVE SURG UNDER LTX SZ8 (GLOVE) ×2 IMPLANT
GOWN STRL REUS W/ TWL LRG LVL3 (GOWN DISPOSABLE) ×2 IMPLANT
GOWN STRL REUS W/ TWL XL LVL3 (GOWN DISPOSABLE) ×4 IMPLANT
GOWN STRL REUS W/TWL LRG LVL3 (GOWN DISPOSABLE) ×1
GOWN STRL REUS W/TWL XL LVL3 (GOWN DISPOSABLE) ×2
HEAD HIP 28 BIOLOX OPTION (Hips) IMPLANT
HEMOVAC 400CC 10FR (MISCELLANEOUS) ×2 IMPLANT
HOLSTER ELECTROSUGICAL PENCIL (MISCELLANEOUS) ×2 IMPLANT
HOOD PEEL AWAY FLYTE STAYCOOL (MISCELLANEOUS) ×4 IMPLANT
IV NS 100ML SINGLE PACK (IV SOLUTION) IMPLANT
IV NS IRRIG 3000ML ARTHROMATIC (IV SOLUTION) ×2 IMPLANT
KIT TURNOVER KIT A (KITS) ×2 IMPLANT
LINER 42MM E (Orthopedic Implant) IMPLANT
LINER ADM MDM INS 28/48 42E (Liner) IMPLANT
MANIFOLD NEPTUNE II (INSTRUMENTS) ×2 IMPLANT
MAT ABSORB  FLUID 56X50 GRAY (MISCELLANEOUS) ×1
MAT ABSORB FLUID 56X50 GRAY (MISCELLANEOUS) ×2 IMPLANT
NDL SPNL 20GX3.5 QUINCKE YW (NEEDLE) ×2 IMPLANT
NEEDLE HYPO 22GX1.5 SAFETY (NEEDLE) IMPLANT
NEEDLE SPNL 20GX3.5 QUINCKE YW (NEEDLE) ×1 IMPLANT
NS IRRIG 1000ML POUR BTL (IV SOLUTION) ×2 IMPLANT
PACK HIP PROSTHESIS (MISCELLANEOUS) ×2 IMPLANT
PENCIL SMOKE EVACUATOR (MISCELLANEOUS) ×2 IMPLANT
PENCIL SMOKE EVACUATOR COATED (MISCELLANEOUS) ×2 IMPLANT
PILLOW ABDUCTION FOAM SM (MISCELLANEOUS) IMPLANT
PILLOW ABDUCTION MEDIUM (MISCELLANEOUS) IMPLANT
PIN STEINMAN 3/16 (PIN) ×2 IMPLANT
PULSAVAC PLUS IRRIG FAN TIP (DISPOSABLE) ×1
RETRIEVER SUT HEWSON (MISCELLANEOUS) ×2 IMPLANT
SCALPEL PROTECTED #10 DISP (BLADE) ×2 IMPLANT
SCREW HEX LP 6.5X20 (Screw) IMPLANT
SCREW HEX LP 6.5X30 (Screw) IMPLANT
SHELL MULTIHOLE ACETABULAR 52E (Miscellaneous) IMPLANT
SLEEVE HIP BIOLOX OPTION MED (Sleeve) IMPLANT
SLEEVE SCD COMPRESS KNEE MED (STOCKING) IMPLANT
SOLUTION IRRIG SURGIPHOR (IV SOLUTION) ×2 IMPLANT
STRAP SAFETY 5IN WIDE (MISCELLANEOUS) ×2 IMPLANT
SUT BONE WAX W31G (SUTURE) ×2 IMPLANT
SUT DVC 2 QUILL PDO  T11 36X36 (SUTURE) ×1
SUT DVC 2 QUILL PDO T11 36X36 (SUTURE) ×2 IMPLANT
SUT DVC VLOC 90 3-0 CV23 VLT (SUTURE)
SUT ETHIBOND #5 BRAIDED 30INL (SUTURE) ×6 IMPLANT
SUT QUILL MONODERM 3-0 PS-2 (SUTURE) ×2 IMPLANT
SUT V-LOC 90 ABS DVC 3-0 CL (SUTURE) IMPLANT
SUT VIC AB 0 CT1 36 (SUTURE) ×2 IMPLANT
SUT VIC AB 2-0 CT2 27 (SUTURE) ×2 IMPLANT
SUT VICRYL 1-0 27IN ABS (SUTURE) ×1
SUTURE DVC VLC 90 3-0 CV23 VLT (SUTURE) ×2 IMPLANT
SUTURE VICRYL 1-0 27IN ABS (SUTURE) ×2 IMPLANT
SWAB CULTURE AMIES ANAERIB BLU (MISCELLANEOUS) IMPLANT
SYR 30ML LL (SYRINGE) ×2 IMPLANT
SYR 50ML LL SCALE MARK (SYRINGE) ×2 IMPLANT
SYR BULB IRRIG 60ML STRL (SYRINGE) ×2 IMPLANT
TAPE MICROFOAM 4IN (TAPE) IMPLANT
TIP FAN IRRIG PULSAVAC PLUS (DISPOSABLE) ×2 IMPLANT
TOWEL OR 17X26 4PK STRL BLUE (TOWEL DISPOSABLE) ×2 IMPLANT
TRAP FLUID SMOKE EVACUATOR (MISCELLANEOUS) ×2 IMPLANT
TUBE KAMVAC SUCTION (TUBING) ×2 IMPLANT
WAND WEREWOLF FASTSEAL 6.0 (MISCELLANEOUS) IMPLANT
WATER STERILE IRR 1000ML POUR (IV SOLUTION) ×2 IMPLANT

## 2022-06-16 NOTE — Anesthesia Procedure Notes (Signed)
Procedure Name: Intubation Date/Time: 06/16/2022 10:47 AM  Performed by: Loletha Grayer, CRNAPre-anesthesia Checklist: Patient identified, Patient being monitored, Timeout performed, Emergency Drugs available and Suction available Patient Re-evaluated:Patient Re-evaluated prior to induction Oxygen Delivery Method: Circle system utilized Preoxygenation: Pre-oxygenation with 100% oxygen Induction Type: IV induction Ventilation: Mask ventilation without difficulty and Oral airway inserted - appropriate to patient size Laryngoscope Size: 3 and McGraph Grade View: Grade I Tube type: Oral Tube size: 7.0 mm Number of attempts: 1 Airway Equipment and Method: Stylet Placement Confirmation: ETT inserted through vocal cords under direct vision, positive ETCO2 and breath sounds checked- equal and bilateral Secured at: 21 cm Tube secured with: Tape Dental Injury: Teeth and Oropharynx as per pre-operative assessment

## 2022-06-16 NOTE — Transfer of Care (Signed)
Immediate Anesthesia Transfer of Care Note  Patient: Kayla Wade  Procedure(s) Performed: LEFT HIP ACETABULAR REVISION (Left: Hip)  Patient Location: PACU  Anesthesia Type:General  Level of Consciousness: awake  Airway & Oxygen Therapy: Patient Spontanous Breathing and Patient connected to face mask oxygen  Post-op Assessment: Report given to RN and Post -op Vital signs reviewed and stable  Post vital signs: Reviewed and stable  Last Vitals:  Vitals Value Taken Time  BP 108/53 06/16/22 1331  Temp 36.6 C 06/16/22 1328  Pulse 75 06/16/22 1332  Resp 16 06/16/22 1332  SpO2 100 % 06/16/22 1332  Vitals shown include unvalidated device data.  Last Pain:  Vitals:   06/16/22 0827  TempSrc: Temporal  PainSc: 0-No pain         Complications: No notable events documented.

## 2022-06-16 NOTE — Interval H&P Note (Signed)
History and Physical Interval Note:  06/16/2022 10:22 AM  Weston Brass  has presented today for surgery, with the diagnosis of Chronic hip pain after total replacement of left hip joint M25.552 Left hip pain M25.552 Loose total hip arthroplasty, initial encounter T84.038A.  The various methods of treatment have been discussed with the patient and family. After consideration of risks, benefits and other options for treatment, the patient has consented to  Procedure(s): LEFT HIP ACETABULAR REVISION (Left) as a surgical intervention.  The patient's history has been reviewed, patient examined, no change in status, stable for surgery.  I have reviewed the patient's chart and labs.  Questions were answered to the patient's satisfaction.     Steffanie Rainwater

## 2022-06-16 NOTE — Anesthesia Preprocedure Evaluation (Signed)
Anesthesia Evaluation  Patient identified by MRN, date of birth, ID band Patient awake    Reviewed: Allergy & Precautions, NPO status , Patient's Chart, lab work & pertinent test results  History of Anesthesia Complications (+) PONV and history of anesthetic complications  Airway Mallampati: III  TM Distance: <3 FB Neck ROM: full    Dental  (+) Missing   Pulmonary shortness of breath and with exertion, asthma ,    Pulmonary exam normal        Cardiovascular Exercise Tolerance: Good hypertension, Normal cardiovascular exam     Neuro/Psych  Headaches, PSYCHIATRIC DISORDERS    GI/Hepatic Neg liver ROS, GERD  Controlled,  Endo/Other  diabetes, Type 2  Renal/GU Renal disease     Musculoskeletal   Abdominal   Peds  Hematology negative hematology ROS (+)   Anesthesia Other Findings Past Medical History: No date: Acid reflux No date: Anxiety No date: Arthritis No date: Asthma No date: Bipolar disorder ([place]) No date: Bradycardia 2010: 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 No date: Depression No date: High cholesterol No date: History of kidney stones     Comment:  h/o No date: Hypertension No date: Low magnesium level No date: PAC (premature atrial contraction) No date: PONV (postoperative nausea and vomiting)     Comment:  nausea only No date: PVC's (premature ventricular contractions) No date: Type 2 diabetes mellitus ([place])  Past Surgical History: No date: ABDOMINAL HYSTERECTOMY     Comment:  partial 2010: BRAIN SURGERY     Comment:  due to cyst-done at [facility]-Pt has shunt 01/22/2013: BREAST BIOPSY; Left     Comment:  BENIGN BREAST TISSUE WITH FIBROCYSTIC CHANGES INCLUDING               USUAL 1998: BREAST SURGERY; Right     Comment:  right breast biopsy No date: CHOLECYSTECTOMY 2006: COLONOSCOPY     Comment:  [employer] Andris Flurry, MD 04/11/2018: COLONOSCOPY WITH  PROPOFOL; N/A     Comment:  Procedure: COLONOSCOPY WITH PROPOFOL;  Surgeon: Robert Bellow, MD;  Location: [facility];  Service:               Endoscopy;  Laterality: N/A; No date: ELBOW SURGERY; Right No date: LASIK No date: TONSILLECTOMY     Comment:  ADENOIDS X 2 06/24/2021: TOTAL HIP ARTHROPLASTY; Left     Comment:  Procedure: TOTAL HIP ARTHROPLASTY ANTERIOR APPROACH;                Surgeon: Hessie Knows, MD;  Location: [facility];                Service: Orthopedics;  Laterality: Left; 1976: TUBAL LIGATION  BMI    Body Mass Index: 27.55 kg/m      Reproductive/Obstetrics negative OB ROS                             Anesthesia Physical Anesthesia Plan  ASA: 3  Anesthesia Plan: General ETT   Post-op Pain Management:    Induction: Intravenous  PONV Risk Score and Plan: Ondansetron, Dexamethasone, Midazolam and Treatment may vary due to age or medical condition  Airway Management Planned: Oral ETT  Additional Equipment:   Intra-op Plan:   Post-operative Plan: Extubation in OR  Informed Consent: I have reviewed the patients History and Physical, chart,  labs and discussed the procedure including the risks, benefits and alternatives for the proposed anesthesia with the patient or authorized representative who has indicated his/her understanding and acceptance.     Dental Advisory Given  Plan Discussed with: Anesthesiologist, CRNA and Surgeon  Anesthesia Plan Comments: (Patient consented for risks of anesthesia including but not limited to:  - adverse reactions to medications - damage to eyes, teeth, lips or other oral mucosa - nerve damage due to positioning  - sore throat or hoarseness - Damage to heart, brain, nerves, lungs, other parts of body or loss of life  Patient voiced understanding.)        Anesthesia Quick Evaluation

## 2022-06-16 NOTE — Op Note (Signed)
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 , painful left total hip l  Procedure: Left hip revision arthroplasty acetabular component only  Components/Implants: Cup 24m styker multi hole shell (E)    Liner: stryker restoration ADM/MDM 28/48 (42E) with 494mMDM liner  Stem: unchanged  Head: 2855meramic medtacer with 12/14 0mm18meeve  Date of Surgery: 06/16/22  Surgeon: ZachSteffanie Rainwater Assistant: ThomDorise Hiss(present and scrubbed throughout the case, critical for assistance with exposure, retraction, instrumentation, and closure)   Anesthesiologist: Piscitello  Anesthesia: Spinal   IVF:700 Urine: 300  EBL: 150  Specimen: Cultures x 3   Complications: None   Brief history: The patient is a 71 y74r old female who underwent a left total hip replacement in October 2022 and was originally doing well but has had consistent increasing  pain in her left groin for the last 8 months.  She reports the pain is worse with walking and activity and hurts more at the end of the day than the beginning of the day and that she feels like something is" moving" her groin. 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   The risks and benefits of revision hip arthroplasty with acetabular component exchange as definitive surgical treatment were discussed with the patient, who opted to proceed with the operation.  After outpatient medical clearance and optimization was completed the patient was [facility].  All preoperative films were reviewed and an appropriate surgical plan was made prior  to surgery.   Description of procedure: The patient was brought to the operating room where laterality was confirmed by all those present to be the left side.  The patient was moved to the table and administered spinal anesthesia. Patient was given an intravenous dose of antibiotics for surgical prophylaxis and TXA. The patient was positioned in lateral decubitus position with all bony prominences well-padded.  Surgical site was prepped with alcohol and chlorhexidine.  Surgical site over the hip was draped in typical sterile fashion with multiple layers of adhesive and nonadhesive drapes.  The incision site over the greater trochanter posteriorly was marked out with a sterile marker. A previous well healed anterior incision was noted.   Surgical timeout was then called with participation of all staff in the room the patient was confirmed and laterality again confirmed.  An incision was made over the lateral aspect of the hip cheating posteriorly on the proximal aspect.  Careful soft tissue dissection and coagulation of all bleeders was carried out down to the level of the glut max fascia.  The fascia was carefully incised in line with the femur.  A Charnley retractor was placed deep to the fascia with care taken to ensure that there was no nerve entrapment in the retractor.  The sciatic nerve was identified posteriorly and protected throughout the duration of the case. the bursa tissue was taken down over the posterior femur exposing the external rotators.  A dull Cobra retractor was placed under the abductor mechanism to protect the mechanism and fully expose the piriformis and short external rotators.  There was scar tissue around the external rotators and part of the piriformis was  still attached.  the external rotators were carefully detached from the femur with electrocautery and tagged with Ethibond sutures.  The capsule to the hip was incised and tagged with sutures.  Upon opening the capsule clean clear  appearing synovial fluid was encountered without any evidence of purulence. The scar tissue was then carefully elevated around the existing acetabular component and around the femoral neck and the femur was carefully mobilized. the femur was then carefully dislocated and the femoral head and bipolar articulation was removed with a bone tamp .  There was minimal damage noted to the remaining trunnion of the femoral component .  A circumferential scar excision was performed around the proximal femur component fully exposing the shoulder and calcar of the femur .  The femoral component was pulled on with a pair of pliers and there was no motion in the femoral component or at the femur component bone interface.  3 tissue cultures were taken at this time and sent for pathology 2 from the acetabulum and one from the femur.   Attention was then turned back to the acetabular component and a small anterior pocket was created over the anterior acetabulum and the femur was brought over anteriorly to fully expose the acetabular component.  Scar tissue and overriding bone material were excised from the rim of the acetabular component.  There was a large amount of fibrous tissue at the interface of the acetabular component and the bone which was excised with electrocautery.  This fibrous tissue extended deep beneath the interface of the acetabular component.  Acetabular cup cutter was used to carefully excise the acetabular component circumferentially starting with a short blade and then a long blade was used.  The acetabular component came out fairly easily with the cup cutter without any bone loss.  The back of the acetabular component was examined and found to have a large amount of fibrous tissue and only a few spots of spot welding to the bone.  The acetabulum was then cleaned with electrocautery and removed any scar tissue that was around the rim.  The acetabulum was then sequentially reamed starting with a 50 mm reamer  and then a 52 mm reamer to get to a good bleeding bone base without any remaining scar or fibrous tissue. The size 45m acetabular component was then opened and implanted.  A drill was used to carefully place the 3 screws into the acetabular component.  Acetabulum was irrigated with pulsatile lavage and a real acetabular MDM metal liner was then placed.    A trial head was  then attached and the hip was reduced.  The hip was found to be stable on reduction with full range of motion without subluxation or dislocation and leg lengths felt equal.  The hip was then carefully dislocated head trial was removed.   The real ball was then placed on the femoral component.  The acetabulum was irrigated and the hip was reduced.  The hip showed good range of motion and stability on testing with both stability in flexion internal rotation and extension external rotation.  The hip was then irrigated with surgiphor and pulsatile lavage.  A 2 mm drill bit was then used to make 2 holes in the greater trochanter the piriformis and capsular tissues were reapproximated and passed through the drill holes and tied over the greater trochanter.  The fascia was then approximated with #1 Vicryl and #2 barbed suture.  The subcutaneous tissues and skin were closed with 0 Vicryl 2-0  Vicryl and 3-0 V lock suture and the skin closed with Dermabond.  A sterile dressing was then applied.  Lap, sharps, and sponge counts were correct at the end of the case.   The patient was then rolled supine and an x-ray was taken in the operating room. Leg lengths were clinically equal on examination. Components appeared in good position with no fractures noted on x-ray.  Patient was then transferred to a hospital bed and transferred to the recovery room in stable condition.

## 2022-06-16 NOTE — Assessment & Plan Note (Addendum)
Patient had a recent coronary CTA with a coronary calcium score of 0 Cardiac angiogram 9/23 reveals LAD has non calcified plaque in the proximal segment causing mild stenosis (25-49%), with no significant coronary disease seen elsewhere. Continue atorvastatin, enalapril

## 2022-06-16 NOTE — Anesthesia Postprocedure Evaluation (Signed)
Anesthesia Post Note  Patient: Kayla Wade  Procedure(s) Performed: LEFT HIP ACETABULAR REVISION (Left: Hip)  Patient location during evaluation: PACU Anesthesia Type: General Level of consciousness: awake and alert Pain management: pain level controlled Vital Signs Assessment: post-procedure vital signs reviewed and stable Respiratory status: spontaneous breathing, nonlabored ventilation, respiratory function stable and patient connected to nasal cannula oxygen Cardiovascular status: blood pressure returned to baseline and stable Postop Assessment: no apparent nausea or vomiting Anesthetic complications: no   No notable events documented.   Last Vitals:  Vitals:   06/16/22 1445 06/16/22 1500  BP: (!) 109/56 116/65  Pulse: 71 72  Resp: 12 11  Temp:    SpO2: 94% 96%    Last Pain:  Vitals:   06/16/22 1500  TempSrc:   PainSc: [place]

## 2022-06-16 NOTE — Assessment & Plan Note (Signed)
Patient is status post revision surgery on 10/12 Management per orthopedic service

## 2022-06-16 NOTE — Assessment & Plan Note (Signed)
Continue HCTZ, enalapril

## 2022-06-16 NOTE — Evaluation (Signed)
Physical Therapy Evaluation Patient Details Name: Kayla Wade MRN: [id number] DOB: 1951-05-03 Today's Date: 06/16/2022  History of Present Illness  Pt is a 71 y.o. female s/p L hip revision arthroplasty acetabular component only secondary L hip loose acetabular component s/p L total hip replacement (painful L total hip).  PMH includes htn, HLD, intraparenchymal cyst with shunt, L THR Oct 2022, goiter, h/o craniotomy, elbow sx, SOB, bipolar disorder, bradycardia.  Clinical Impression  Prior to surgery, pt was independent with functional mobility; lives with family on main level of home with steps to enter.  Currently pt is min assist with bed mobility; CGA with transfers using RW; and CGA to take steps in place and to sidestep to R (along bed towards head of bed) using RW.  8/10 L hip pain at rest beginning of session; 5/10 after standing activities; and 2-3/10 at rest end of session.  Pt would benefit from skilled PT to address noted impairments and functional limitations (see below for any additional details).  Upon hospital discharge, pt would benefit from [facility].    Recommendations for follow up therapy are one component of a multi-disciplinary discharge planning process, led by the attending physician.  Recommendations may be updated based on patient status, additional functional criteria and insurance authorization.  Follow Up Recommendations Home health PT      Assistance Recommended at Discharge Intermittent Supervision/Assistance  Patient can return home with the following  A little help with walking and/or transfers;A little help with bathing/dressing/bathroom;Assistance with cooking/housework;Assist for transportation;Help with stairs or ramp for entrance    Equipment Recommendations Rolling walker (2 wheels);BSC/3in1  Recommendations for Other Services  OT consult    Functional Status Assessment Patient has had a recent decline in their functional status and demonstrates the  ability to make significant improvements in function in a reasonable and predictable amount of time.     Precautions / Restrictions Precautions Precautions: Fall;Posterior Hip Restrictions Weight Bearing Restrictions: Yes LLE Weight Bearing: Weight bearing as tolerated      Mobility  Bed Mobility Overal bed mobility: Needs Assistance Bed Mobility: Supine to Sit, Sit to Supine     Supine to sit: Min assist, HOB elevated Sit to supine: Min assist, HOB elevated   General bed mobility comments: assist for L LE; vc's for technique    Transfers Overall transfer level: Needs assistance Equipment used: Rolling walker (2 wheels) Transfers: Sit to/from Stand Sit to Stand: Min guard           General transfer comment: vc's for UE/LE placement and posterior THP's    Ambulation/Gait Ambulation/Gait assistance: Min guard Gait Distance (Feet):  (pt took 10 steps in place B LE's and then sidestepped along bed a few feet with RW (towards R)) Assistive device: Rolling walker (2 wheels)   Gait velocity: decreased     General Gait Details: antalgic; decreased stance time L LE  Stairs            Wheelchair Mobility    Modified Rankin (Stroke Patients Only)       Balance Overall balance assessment: Needs assistance Sitting-balance support: No upper extremity supported, Feet supported Sitting balance-Leahy Scale: Good Sitting balance - Comments: steady sitting reaching within BOS   Standing balance support: Single extremity supported Standing balance-Leahy Scale: Fair Standing balance comment: steady standing with at least single UE support  Pertinent Vitals/Pain Pain Assessment Pain Assessment: 0-10 Pain Score: 3  Pain Location: L hip Pain Descriptors / Indicators: Tender, Sore Pain Intervention(s): Limited activity within patient's tolerance, Monitored during session, Premedicated before session, Repositioned Vitals (HR  and O2 on room air) stable and WFL throughout treatment session.    Home Living Family/patient expects to be discharged to:: Private residence Living Arrangements: Other relatives (grandson and his girlfriend; 3 great grandchildren) Available Help at Discharge: Family;Available PRN/intermittently Type of Home: House Home Access: Stairs to enter   [employer] of Steps: 2 with L railing plus 1 no railing (holds onto door frame)   Home Layout: Two level;Able to live on main level with bedroom/bathroom;Laundry or work area in basement (pt will have someone doing her laundry) Home Equipment: Conservation officer, nature (2 wheels);Cane - single point;Shower seat      Prior Function Prior Level of Function : Independent/Modified Independent             Mobility Comments: pt reports 2 falls in past 6 months (pt fell OOB 1x and off of couch 1x--both when reaching for telephone)       Hand Dominance        Extremity/Trunk Assessment   Upper Extremity Assessment Upper Extremity Assessment: Overall WFL for tasks assessed    Lower Extremity Assessment Lower Extremity Assessment: LLE deficits/detail (R LE WFL) LLE Deficits / Details: at least 3-/5 hip flexion, at least 3/5 AROM knee flexion/extension and DF/PF LLE: Unable to fully assess due to pain    Cervical / Trunk Assessment Cervical / Trunk Assessment: Normal  Communication   Communication: No difficulties  Cognition Arousal/Alertness: Awake/alert Behavior During Therapy: WFL for tasks assessed/performed Overall Cognitive Status: Within Functional Limits for tasks assessed                                          General Comments General comments (skin integrity, edema, etc.): no drainage noted L hip dressing beginning/end of session.  Nursing cleared pt for participation in physical therapy.  Pt agreeable to PT session.    Exercises Total Joint Exercises Ankle Circles/Pumps: AROM, Strengthening, Both,  10 reps, Supine Quad Sets: AROM, Strengthening, Both, 10 reps, Supine Short Arc Quad: AROM, Strengthening, Left, 10 reps, Supine Heel Slides: AAROM, Strengthening, Left, 10 reps, Supine Hip ABduction/ADduction: AAROM, Strengthening, Left, 10 reps, Supine   Assessment/Plan    PT Assessment Patient needs continued PT services  PT Problem List Decreased strength;Decreased activity tolerance;Decreased balance;Decreased mobility;Decreased knowledge of use of DME;Decreased knowledge of precautions;Pain;Decreased skin integrity       PT Treatment Interventions DME instruction;Gait training;Stair training;Functional mobility training;Therapeutic activities;Therapeutic exercise;Balance training;Patient/family education    PT Goals (Current goals can be found in the Care Plan section)  Acute Rehab PT Goals Patient Stated Goal: to improve mobility and pain PT Goal Formulation: With patient Time For Goal Achievement: 06/30/22 Potential to Achieve Goals: Good    Frequency BID     Co-evaluation               AM-PAC PT "6 Clicks" Mobility  Outcome Measure Help needed turning from your back to your side while in a flat bed without using bedrails?: A Little Help needed moving from lying on your back to sitting on the side of a flat bed without using bedrails?: A Little Help needed moving to and from a bed to a chair (including  a wheelchair)?: A Little Help needed standing up from a chair using your arms (e.g., wheelchair or bedside chair)?: A Little Help needed to walk in hospital room?: A Little Help needed climbing 3-5 steps with a railing? : A Little 6 Click Score: 18    End of Session Equipment Utilized During Treatment: Gait belt Activity Tolerance: Patient tolerated treatment well Patient left: in bed;with call bell/phone within reach;with SCD's reapplied;Other (comment) (hip aBduction pillow in place) Nurse Communication: Mobility status;Precautions;Weight bearing status;Other  (comment) (pt's pain status) PT Visit Diagnosis: Other abnormalities of gait and mobility (R26.89);Muscle weakness (generalized) (M62.81);History of falling (Z91.81);Pain Pain - Right/Left: Left Pain - part of body: Hip    Time: 8676-7209 PT Time Calculation (min) (ACUTE ONLY): 30 min   Charges:   PT Evaluation $PT Eval Low Complexity: 1 Low PT Treatments $Therapeutic Exercise: 8-22 mins $Therapeutic Activity: 8-22 mins       Leitha Bleak, PT 06/16/22, 6:06 PM

## 2022-06-16 NOTE — Assessment & Plan Note (Signed)
Not presently on any meds per med rec

## 2022-06-16 NOTE — H&P (Signed)
History of Present Illness: Kayla Wade is an 71 y.o. female who presents for evaluation of left hip pain.  Patient underwent a left total hip replacement in October 2022 and was originally doing well but has had consistent increasing 9 pain in her left groin for the last 8 months.  She reports the pain is worse with walking and activity and hurts more at the end of the day than the beginning of the day and that she feels like something is" moving" her groin.  She does endorse sometimes having a little bit of pain in her thigh but it is primarily in her left groin.  She denies any trauma or injury to the area but does note a little bit of numbness in the groin at times.  She has tried additional physical therapy and ibuprofen with no improvement.  Patient underwent a bone scan, CT scan with contrast, and left hip aspiration prior to this visit.  Patient denies any fevers chills redness warmth chest pain shortness of breath.      Past Medical History:     Past Medical History:  Diagnosis Date   Depressive disorder, not elsewhere classified 04/28/2011    hx of [facility] admission   GERD (gastroesophageal reflux disease)     Goiter     H/O craniotomy      poor short term memory   Hypertension     Low blood magnesium 09/10/2021   Pure hypercholesterolemia 04/28/2011   Urethral stricture unspecified 04/28/2011      Past Surgical History:      Past Surgical History:  Procedure Laterality Date   BREAST LESION Right 1998    RIGHT BREAST LESION REMOVAL-BENIGN   BREAST EXCISIONAL BIOPSY Left 01/22/2013   COLONOSCOPY   04/11/2018   ARTHROPLASTY HIP TOTAL Left 06/24/2021   CHOLECYSTECTOMY       CRANIOTOMY        WITH VENTRICULAR CYST DRAINAGE   ELBOW SURGERY       HYSTERECTOMY        PARTIAL DUE TO BLEEDING   KIDNEY STONE SURGERY       TONSILLECTOMY       TUBAL LIGATION        BTL      Past Family History: Family History       Family History  Problem Relation Age of Onset   High  blood pressure (Hypertension) Mother     Thyroid cancer Mother     Skin cancer, non-melanoma Mother     Diabetes type II Mother     Myocardial Infarction (Heart attack) Mother     Heart failure Mother          died 42s   Other Father          died spinal meningitis   Lung cancer Sister     COPD Sister     Breast cancer Neg Hx         Medications:       Current Outpatient Medications Ordered in Epic  Medication Sig Dispense Refill   albuterol 90 mcg/actuation inhaler Inhale 2 inhalations into the lungs 4 (four) times daily as needed for Wheezing 1 each 1   atorvastatin (LIPITOR) 40 MG tablet Take 1 tablet (40 mg total) by mouth once daily 30 tablet 11   cyanocobalamin (VITAMIN B12) 1000 MCG tablet Take 2 tablets (2,000 mcg total) by mouth once daily 400 tablet 1   enalapril (VASOTEC) 20 MG tablet Take 0.5 tablets (10  mg total) by mouth 2 (two) times daily 30 tablet 11   magnesium chloride (SLOW-MAG) 71.5 mg DR tablet Take 1 tablet by mouth once daily Each tablet contains 71.5 mg of magnesium 30 tablet 11   magnesium oxide (MAG-OX) 400 mg (241.3 mg magnesium) tablet Take 400 mg by mouth 4 (four) times daily       metFORMIN (GLUCOPHAGE-XR) 500 MG XR tablet Take 1 tablet (500 mg total) by mouth 2 (two) times daily 60 tablet 3   risperiDONE (RISPERDAL) 0.5 MG tablet Take 1 tablet (0.5 mg total) by mouth at bedtime 90 tablet 3    No current Epic-ordered facility-administered medications on file.      Allergies:     Allergies  Allergen Reactions   Tussionex Pennkinetic Er [Hydrocodone-Chlorpheniramine] Vomiting   Ambien [Zolpidem] Hallucination   Erythromycin Nausea   Sulfa (Sulfonamide Antibiotics) Vomiting      Review of Systems:  A comprehensive 14 point ROS was performed, reviewed, and the pertinent orthopaedic findings are documented in the HPI.   Physical Exam: Body mass index is 27.81 kg/m. General/Constitutional: No apparent distress: well-nourished and well  developed. Lymphatic: No palpable adenopathy. Pulmonary exam: Lungs clear to auscultation bilaterally no wheezing rales or rhonchi Cardiac exam: Regular rate and rhythm no obvious murmurs rubs or gallops.   Vascular: No edema, swelling or tenderness, except as noted in detailed exam. Integumentary: No impressive skin lesions present, except as noted in detailed exam. Neuro/Psych: Normal mood and affect, oriented to person, place and time. Musculoskeletal: Normal, except as noted in detailed exam and in HPI.   Left  hip exam   SKIN: Well-healed incision over the anterior hip skin intact over the posterior hip SWELLING: none WARMTH: no warmth TENDERNESS: moderate to motion of the hip located over the groin, [place] Positive ROM: 15 degrees internal rotation and 40 degrees external rotation and pain with internal rotation STRENGTH: normal GAIT: stiff-legged STABILITY: stable to testing CREPITUS: no LEG LENGTH DISCREPANCY: none NEUROLOGICAL EXAM: normal VASCULAR EXAM: normal distal pulses intact LUMBAR SPINE:        tenderness: no                                     straight leg raising sign: no                                     motor exam: normal   The contralateral hip was examined for comparison and it showed: TENDERNESS: none ROM: normal and full STRENGTH: normal STABILITY: stable to testing   Hip Imaging :  X-rays AP and lateral of the left hip taken on 04/29/2022 show left hip status post total hip arthroplasty components in appropriate position did not appear to have moved from previous imaging there is noted lucent lines along the superior and superior lateral aspects of the acetabular component no obvious fractures or dislocations.    X-rays taken today in the office and ordered by me cross table standing and sitting lateral of the pelvis show components in appropriate position status post left total hip replacement mild degenerative changes lumbosacral spine sacral  slope 35 degrees standing and 9 degrees sitting showing an appropriate 26 degrees of motion from sit to stand.    Three-phase bone scan reviewed taken 05/11/2022 which show increased uptake of tracer around the left  acetabulum and surrounding acetabular cup and some tracer uptake around the proximal greater trochanter which could represent aseptic loosening versus infection.     CT scan reviewed from 05/16/2022 which showed new lucencies in the superior and anterior superior areas around the left acetabulum compared to prior CT scan of the abdomen pelvis from 09/10/2021.  The component of the femur appears well fixed.    Hip aspiration performed in the office 05/17/2022 by Dr. Candelaria Stagers shows no growth to date and no crystals, they were unable to perform a cell count on the sample due to insufficient quantity.    ESR from 04/29/2022 mildly elevated at 34.     Assessment:      Encounter Diagnosis  Name Primary?   Left hip pain Yes  Painful Left Total hip Loose Left total hip     Plan: Kayla Wade is a 71 year old female status post left total hip replacement with pain and CT bone scan and x-ray findings consistent with loosening of the left acetabular component.  Based upon the patient's continued symptoms and failure to respond to conservative treatment, I have recommended a revision left total hip replacement for this patient. A long discussion took place with the patient describing what a revision total joint replacement is and what the procedure would entail. A hip model, similar to the implants that will be used during the operation, was utilized to demonstrate the implants. Choices of implant manufactures were discussed and reviewed. The ability to secure the implant utilizing cement or cementless (press fit) fixation was discussed. Anterior and posterior exposures were discussed. For this patient an appropriate approach will be posterior.  We discussed that the plan would be to only address the  acetabular component if the femoral component is well fixed.  We also discussed the possibility of encountering infection during the surgery and the potential need to remove all of the components, perform osteotomies, utilize cement or a antibiotic cement spacer.    The hospitalization and post-operative care and rehabilitation were also discussed. The use of perioperative antibiotics and DVT prophylaxis were discussed. The risk, benefits and alternatives to a surgical intervention were discussed at length with the patient. The patient was also advised of risks related to the medical comorbidities and elevated body mass index (BMI). A lengthy discussion took place to review the most common complications including but not limited to: deep vein thrombosis, pulmonary embolus, heart attack, stroke, infection, wound breakdown, dislocation, numbness, leg length in-equality, damage to nerves, tendon,muscles, arteries or other blood vessels, death and other possible complications from anesthesia. The patient was told that we will take steps to minimize these risks by using sterile technique, antibiotics and DVT prophylaxis when appropriate and follow the patient postoperatively in the office setting to monitor progress. The possibility of recurrent pain, no improvement in pain and actual worsening of pain were also discussed with the patient. The risk of dislocation following total hip replacement was discussed and potential precautions to prevent dislocation were reviewed.    The discharge plan of care focused on the patient going home following surgery. The patient was encouraged to make the necessary arrangements to have someone stay with them when they are discharged home.    The benefits of surgery were discussed with the patient including the potential for improving the patient's current clinical condition through operative intervention. Alternatives to surgical intervention including continued conservative  management were also discussed in detail. All questions were answered to the satisfaction of the patient. The patient participated and  agreed to the plan of care as well as the use of the recommended implants for their total hip replacement surgery.         Patient received outpatient clearance for surgery. Hemoglobin A1c is elevated at 7.9 and the elevated risks associated with this were discussed with the patient and the importance of glycemic control were discussed. Despite the elevated risks and due to the continued pain and disability the patient is experiencing she would still like to proceed with surgery.

## 2022-06-16 NOTE — Assessment & Plan Note (Signed)
Patient was on metformin Blood sugar was 450 IV fluid bolus ordered.  BMP ordered showing blood sugar 422 with normal anion gap Blood sugar already showing improvement, 422-352 with IV fluid bolus We will continue sliding scale insulin coverage at this time and restart home metformin

## 2022-06-16 NOTE — Assessment & Plan Note (Signed)
Renal function at baseline 

## 2022-06-16 NOTE — Consult Note (Signed)
Initial Consultation Note   Patient: Kayla Wade EYC:[id number] DOB: 1951-03-14 PCP: [place] DOA: 06/16/2022 DOS: the patient was seen and examined on 06/16/2022 Primary service: Steffanie Rainwater, MD  Referring physician: Dr. [name] Reason for consult: Management of hyperglycemia  Assessment/Plan:  Assessment and Plan: * Mechanical loosening of prosthetic hip [facility]) Patient is status post revision surgery on 10/12 Management per orthopedic service  Uncontrolled type 2 diabetes mellitus with hyperglycemia, without long-term current use of insulin ([place]) Patient was on metformin Blood sugar was 450 IV fluid bolus ordered.  BMP ordered showing blood sugar 422 with normal anion gap Blood sugar already showing improvement, 422-352 with IV fluid bolus We will continue sliding scale insulin coverage at this time and restart home metformin  Stage 3a chronic kidney disease (HCC) Renal function at baseline  Hyperlipidemia Continue atorvastatin  Coronary artery disease involving native coronary artery of native heart without angina pectoris Patient had a recent coronary CTA with a coronary calcium score of 0 Cardiac angiogram 9/23 reveals LAD has non calcified plaque in the proximal segment causing mild stenosis (25-49%), with no significant coronary disease seen elsewhere. Continue atorvastatin, enalapril  Hypertension Continue HCTZ, enalapril  Depression Not presently on any meds per med rec       [facility] will continue to follow the patient.  HPI: Kayla Wade is a 71 y.o. female with past medical history  of noninsulin-dependent type 2 diabetes, HTN, CKD 3A, HLD, depression/bipolar, who is postop day 0 from left hip revision arthroplasty for loose acetabular component. Patient had an uneventful surgery and routine blood blood work after surgery revealed a blood glucose of 450.  Patient was otherwise asymptomatic, without nausea, vomiting or abdominal pain.  Had no  chest pain or shortness of breath.  Vitals at the time of consultation were within normal limits.  Last blood work was from 10/3 and was unremarkable.  Review of Systems: As mentioned in the history of present illness. All other systems reviewed and are negative. 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   Depression    High cholesterol    History of kidney stones    h/o   Hypertension    Low magnesium level    PAC (premature atrial contraction)    PONV (postoperative nausea and vomiting)    nausea only   PVC's (premature ventricular contractions)    Type 2 diabetes mellitus ([place])    Past Surgical History:  Procedure Laterality Date   ABDOMINAL HYSTERECTOMY     partial   BRAIN SURGERY  2010   due to cyst-done at [facility]-Pt has shunt   BREAST BIOPSY Left 01/22/2013   BENIGN BREAST TISSUE WITH FIBROCYSTIC CHANGES INCLUDING USUAL   BREAST SURGERY Right 1998   right breast biopsy   CHOLECYSTECTOMY     COLONOSCOPY  2006   [place], [place]   COLONOSCOPY WITH PROPOFOL N/A 04/11/2018   Procedure: COLONOSCOPY WITH PROPOFOL;  Surgeon: Robert Bellow, MD;  Location: [facility] ENDOSCOPY;  Service: Endoscopy;  Laterality: N/A;   ELBOW SURGERY Right    LASIK     TONSILLECTOMY     ADENOIDS X 2   TOTAL HIP ARTHROPLASTY Left 06/24/2021   Procedure: TOTAL HIP ARTHROPLASTY ANTERIOR APPROACH;  Surgeon: Hessie Knows,  MD;  Location: [facility] ORS;  Service: Orthopedics;  Laterality: Left;   TUBAL LIGATION  1976   Social History:  reports that she has never smoked. She quit smokeless tobacco use about 13 years ago.  Her smokeless tobacco use included chew. She reports that she does not drink alcohol and does not use drugs.  Allergies  Allergen Reactions   Tussionex Pennkinetic Er [Hydrocod Poli-Chlorphe Poli Er] Nausea Only   Zolpidem  Other (See Comments)    hallucinations   Erythromycin Base Rash    Family History  Problem Relation Age of Onset   Lung cancer Sister    Breast cancer Neg Hx     Prior to Admission medications   Medication Sig Start Date End Date Taking? Authorizing Provider  acetaminophen (TYLENOL) 500 MG tablet Take 1,000 mg by mouth every 8 (eight) hours as needed for mild pain.   Yes [provider]  albuterol (VENTOLIN HFA) 108 (90 Base) MCG/ACT inhaler Inhale 2 puffs into the lungs every 6 (six) hours as needed for wheezing or shortness of breath.   Yes [provider]  atorvastatin (LIPITOR) 40 MG tablet Take 40 mg by mouth daily. 03/04/22  Yes [provider]  enalapril (VASOTEC) 20 MG tablet Take 5 mg by mouth 2 (two) times daily. 06/04/21  Yes [provider]  famotidine (PEPCID) 10 MG tablet Take 10 mg by mouth daily.   Yes [provider]  Fluticasone Propionate, Inhal, 100 MCG/ACT AEPB Inhale 2 puffs into the lungs 2 (two) times daily.   Yes [provider]  hydrochlorothiazide (MICROZIDE) 12.5 MG capsule Take 12.5 mg by mouth daily.   Yes [provider]  magnesium oxide (MAG-OX) 400 (240 Mg) MG tablet Take 400 mg by mouth QID. 02/08/21  Yes [provider]  Multiple Vitamins-Minerals (MULTIVITAMIN WITH MINERALS) tablet Take 1 tablet by mouth daily.   Yes [provider]  risperiDONE (RISPERDAL) 0.5 MG tablet Take 0.5 mg by mouth at bedtime.   Yes [provider]  SLOW-MAG 71.5-119 MG TBEC SR tablet Take 1 tablet by mouth daily. 04/27/21  Yes [provider]  vitamin B-12 (CYANOCOBALAMIN) 1000 MCG tablet Take 2,000 mcg by mouth daily.   Yes [provider]  famotidine (PEPCID) 20 MG tablet Take 20 mg by mouth daily as needed for heartburn or indigestion. Patient not taking: Reported on 05/19/2022    [provider]  meclizine (ANTIVERT) 12.5 MG tablet Take 1 tablet (12.5 mg total) by  mouth 3 (three) times daily as needed for dizziness. 05/31/22   Brimage, Ronnette Juniper, DO  metFORMIN (GLUCOPHAGE-XR) 500 MG 24 hr tablet Take 500 mg by mouth 2 (two) times daily. 10/09/15 06/18/21  [provider]  methocarbamol (ROBAXIN) 500 MG tablet Take 1 tablet (500 mg total) by mouth every 6 (six) hours as needed for muscle spasms. Patient not taking: Reported on 05/19/2022 06/25/21   Duanne Guess, PA-C  polyethylene glycol (MIRALAX / GLYCOLAX) 17 g packet Take 17 g by mouth daily as needed for mild constipation. Patient not taking: Reported on 05/19/2022 06/25/21   Duanne Guess, PA-C  traMADol (ULTRAM) 50 MG tablet Take 1 tablet (50 mg total) by mouth every 6 (six) hours as needed. Patient not taking: Reported on 06/16/2022 06/25/21   Duanne Guess, PA-C      Labs Vitals:   06/16/22 1500 06/16/22 1600 06/16/22 1731 06/16/22 1956  BP: 116/65 (!) 117/59 (!) 111/58 121/60  Pulse: 72 77 92 89  Resp: '11 12 14 16  '$ Temp:  (!) 97.2 F (36.2 C) 97.6 F (36.4 C) (!) 97.3 F (36.3 C)  TempSrc:    Temporal  SpO2: 96% 96% 96% 96%  Weight:      Height:      Physical Exam Vitals and nursing note reviewed.  Constitutional:      General: She is not in acute distress. HENT:     Head: Normocephalic and atraumatic.  Cardiovascular:     Rate and Rhythm: Normal rate and regular rhythm.     Heart sounds: Normal heart sounds.  Pulmonary:     Effort: Pulmonary effort is normal.     Breath sounds: Normal breath sounds.  Abdominal:     Palpations: Abdomen is soft.     Tenderness: There is no abdominal tenderness.  Neurological:     Mental Status: Mental status is at baseline.    Results for orders placed or performed during the hospital encounter of 06/16/22 (from the past 24 hour(s))  Glucose, capillary     Status: Abnormal   Collection Time: 06/16/22  8:13 AM  Result Value Ref Range   Glucose-Capillary 193 (H) 70 - 99 mg/dL   Comment 1 Notify RN    Comment 2 Document in  Chart   Aerobic/Anaerobic Culture w Gram Stain (surgical/deep wound)     Status: None (Preliminary result)   Collection Time: 06/16/22 12:19 PM   Specimen: PATH Other; Tissue  Result Value Ref Range   Specimen Description      HIP LEFT FEMUR Performed at [facility], [address] [place], [place] [address]    Special Requests      NONE Performed at [facility], [address] [place], [place] [address]    Gram Stain      NO WBC SEEN NO ORGANISMS SEEN Performed at [facility], [place] [address], [place], [place] [address]    Culture PENDING    Report Status PENDING   Aerobic/Anaerobic Culture w Gram Stain (surgical/deep wound)     Status: None (Preliminary result)   Collection Time: 06/16/22 12:20 PM   Specimen: PATH Other; Tissue  Result Value Ref Range   Specimen Description      HIP LEFT HIP ACETABULUM 1 Performed at [facility], [address] [place], [place] [address]    Special Requests      NONE Performed at [facility], [place], [place] [address]    Gram Stain      FEW WBC PRESENT,BOTH PMN AND MONONUCLEAR NO ORGANISMS SEEN Performed at [facility], [place] [address] [place], [place] [address]    Culture PENDING    Report Status PENDING   Aerobic/Anaerobic Culture w Gram Stain (surgical/deep wound)     Status: None (Preliminary result)   Collection Time: 06/16/22 12:20 PM   Specimen: PATH Other; Tissue  Result Value Ref Range   Specimen Description      HIP LEFT ACETABULUM 2 Performed at [facility], [address] [place], [place] [address]    Special Requests      NONE Performed at [facility], [address] [place], [place] [address]    Gram Stain      NO WBC SEEN NO ORGANISMS SEEN Performed at [facility], [place] [address] [place], [place] [address]    Culture PENDING    Report Status PENDING   Glucose, capillary     Status:  Abnormal    Collection Time: 06/16/22  1:36 PM  Result Value Ref Range   Glucose-Capillary 244 (H) 70 - 99 mg/dL  Glucose, capillary     Status: Abnormal   Collection Time: 06/16/22  5:31 PM  Result Value Ref Range   Glucose-Capillary 398 (H) 70 - 99 mg/dL  Glucose, capillary     Status: Abnormal   Collection Time: 06/16/22  6:56 PM  Result Value Ref Range   Glucose-Capillary 447 (H) 70 - 99 mg/dL  Glucose, capillary     Status: Abnormal   Collection Time: 06/16/22  7:21 PM  Result Value Ref Range   Glucose-Capillary 450 (H) 70 - 99 mg/dL  Basic metabolic panel     Status: Abnormal   Collection Time: 06/16/22  8:27 PM  Result Value Ref Range   Sodium 130 (L) 135 - 145 mmol/L   Potassium 4.5 3.5 - 5.1 mmol/L   Chloride 102 98 - 111 mmol/L   CO2 18 (L) 22 - 32 mmol/L   Glucose, Bld 422 (H) 70 - 99 mg/dL   BUN 18 8 - 23 mg/dL   Creatinine, Ser 1.23 (H) 0.44 - 1.00 mg/dL   Calcium 7.8 (L) 8.9 - 10.3 mg/dL   GFR, Estimated 48 (L) >60 mL/min   Anion gap 10 5 - 15  Glucose, capillary     Status: Abnormal   Collection Time: 06/16/22  9:15 PM  Result Value Ref Range   Glucose-Capillary 351 (H) 70 - 99 mg/dL     Data Reviewed: Relevant notes from primary care and specialist visits, past discharge summaries as available in EHR, including Care Everywhere. Prior diagnostic testing as pertinent to current admission diagnoses Updated medications and problem lists for reconciliation ED course, including vitals, labs, imaging, treatment and response to treatment Triage notes, nursing and pharmacy notes and ED provider's notes Notable results as noted in HPI   Family Communication:  Primary team communication:  Thank you very much for involving [place] in the care of your patient.  Author: Athena Masse, MD 06/16/2022 9:47 PM  For on call review [email or web address].

## 2022-06-16 NOTE — Assessment & Plan Note (Signed)
Continue atorvastatin

## 2022-06-17 ENCOUNTER — Encounter: Payer: Self-pay | Admitting: Orthopedic Surgery

## 2022-06-17 DIAGNOSIS — E1165 Type 2 diabetes mellitus with hyperglycemia: Secondary | ICD-10-CM

## 2022-06-17 LAB — CBC
HCT: 25.4 % — ABNORMAL LOW (ref 36.0–46.0)
Hemoglobin: 8.6 g/dL — ABNORMAL LOW (ref 12.0–15.0)
MCH: 28.9 pg (ref 26.0–34.0)
MCHC: 33.9 g/dL (ref 30.0–36.0)
MCV: 85.2 fL (ref 80.0–100.0)
Platelets: 194 10*3/uL (ref 150–400)
RBC: 2.98 MIL/uL — ABNORMAL LOW (ref 3.87–5.11)
RDW: 11.6 % (ref 11.5–15.5)
WBC: 13 10*3/uL — ABNORMAL HIGH (ref 4.0–10.5)
nRBC: 0 % (ref 0.0–0.2)

## 2022-06-17 LAB — GLUCOSE, CAPILLARY
Glucose-Capillary: 226 mg/dL — ABNORMAL HIGH (ref 70–99)
Glucose-Capillary: 273 mg/dL — ABNORMAL HIGH (ref 70–99)

## 2022-06-17 MED ORDER — ENOXAPARIN SODIUM 40 MG/0.4ML IJ SOSY: 40.0000 mg | PREFILLED_SYRINGE | INTRAMUSCULAR | 0 refills | Status: DC

## 2022-06-17 MED ORDER — FE FUMARATE-B12-VIT C-FA-IFC PO CAPS: 1.0000 | ORAL_CAPSULE | Freq: Two times a day (BID) | ORAL | Status: DC

## 2022-06-17 MED ORDER — ACETAMINOPHEN 500 MG PO TABS
ORAL_TABLET | ORAL | Status: AC
  Administered 2022-06-17: 1000 mg via ORAL
  Filled 2022-06-17: qty 2

## 2022-06-17 MED ORDER — DOCUSATE SODIUM 100 MG PO CAPS: 100.0000 mg | ORAL_CAPSULE | Freq: Two times a day (BID) | ORAL | 0 refills | Status: AC

## 2022-06-17 MED ORDER — HYDROCHLOROTHIAZIDE 12.5 MG PO TABS: 12.5000 mg | ORAL_TABLET | Freq: Every day | ORAL | Status: DC

## 2022-06-17 MED ORDER — KETOROLAC TROMETHAMINE 15 MG/ML IJ SOLN
INTRAMUSCULAR | Status: AC
  Administered 2022-06-17: 7.5 mg via INTRAVENOUS
  Filled 2022-06-17: qty 1

## 2022-06-17 MED ORDER — CELECOXIB 200 MG PO CAPS: 200.0000 mg | ORAL_CAPSULE | Freq: Two times a day (BID) | ORAL | 0 refills | Status: AC

## 2022-06-17 MED ORDER — INSULIN ASPART 100 UNIT/ML IJ SOLN
INTRAMUSCULAR | Status: AC
  Administered 2022-06-17: 5 [IU] via SUBCUTANEOUS
  Filled 2022-06-17: qty 1

## 2022-06-17 MED ORDER — ENALAPRIL MALEATE 5 MG PO TABS: 5.0000 mg | ORAL_TABLET | Freq: Two times a day (BID) | ORAL | Status: DC

## 2022-06-17 MED ORDER — SODIUM CHLORIDE 0.9 % IV BOLUS: 250.0000 mL | Freq: Once | INTRAVENOUS | Status: DC

## 2022-06-17 MED ORDER — ENOXAPARIN SODIUM 40 MG/0.4ML IJ SOSY
PREFILLED_SYRINGE | INTRAMUSCULAR | Status: AC
  Administered 2022-06-17: 40 mg via SUBCUTANEOUS
  Filled 2022-06-17: qty 0.4

## 2022-06-17 MED ORDER — FE FUMARATE-B12-VIT C-FA-IFC PO CAPS: 1.0000 | ORAL_CAPSULE | Freq: Two times a day (BID) | ORAL | 0 refills | Status: DC

## 2022-06-17 MED ORDER — TRAMADOL HCL 50 MG PO TABS: 50.0000 mg | ORAL_TABLET | Freq: Four times a day (QID) | ORAL | 0 refills | Status: DC | PRN

## 2022-06-17 NOTE — Progress Notes (Signed)
Progress Note    Kayla Wade  VVO:[id number] DOB: September 19, 1950  DOA: 06/16/2022 PCP: [place]      Brief Narrative:    Medical records reviewed and are as summarized below:  Kayla Wade is a 71 y.o. female with medical history significant for type 2 diabetes mellitus on metformin, hypertension, CKD stage IIIa, hyperlipidemia, depression, bipolar disorder, who presented to the hospital for left hip revision arthroplasty for loose acetabular component.  Postoperatively, patient was found to have a blood glucose level of 450.  She was otherwise asymptomatic.  Hospitalist team was consulted to assist with management of hyperglycemia.  Her hemoglobin A1c on 06/07/2022 was 7.9.  She said her glucose level is usually fairly controlled.  However, she said she had consumed sugary drinks and foods the day prior to admission for surgery.        Assessment/Plan:   Principal Problem:   Mechanical loosening of prosthetic hip (HCC) Active Problems:   Uncontrolled type 2 diabetes mellitus with hyperglycemia, without long-term current use of insulin (HCC)   Depression   Hypertension   Coronary artery disease involving native coronary artery of native heart without angina pectoris   Hyperlipidemia   Stage 3a chronic kidney disease (HCC)   Body mass index is 27.55 kg/m.  Type II DM with hyperglycemia: Glucose level has improved from 450-226.  She attributes hyperglycemia to recent consumption of sugary foods and drinks a day prior to admission for surgery.  Otherwise, glucose levels are generally fairly controlled on metformin at home.  Hemoglobin A1c was 7.9 on 06/07/2022.  Use NovoLog as needed for hyperglycemia.  She can resume metformin at discharge.  Left hip loose acetabular component: S/p left hip revision arthroplasty acetabular component postop day 1 follow-up with orthopedic surgeon.  Anemia: Hemoglobin was 12.7 on 06/07/2022.  Hemoglobin is down to 8.6 today.  This  could be postoperative blood loss anemia.  Monitor H&H.  Will defer decision for blood transfusion to orthopedic surgeon.  Other comorbidities include CAD (recent coronary CTA showed a coronary calcium score of 0), hypertension, hyperlipidemia, depression   Diet Order             Diet Carb Modified Fluid consistency: Thin; Room service appropriate? Yes  Diet effective now                            Consultants: Orthopedic surgeon  Procedures: S/p left hip revision arthroplasty acetabular component     Medications:    acetaminophen  1,000 mg Oral Q8H   atorvastatin  40 mg Oral Daily   docusate sodium  100 mg Oral BID   [START ON 06/18/2022] enalapril  5 mg Oral BID   enoxaparin (LOVENOX) injection  40 mg Subcutaneous Q24H   [START ON 06/18/2022] hydrochlorothiazide  12.5 mg Oral Daily   insulin aspart  0-15 Units Subcutaneous TID WC   insulin aspart  0-5 Units Subcutaneous QHS   ketorolac  7.5 mg Intravenous Q6H   ketorolac       pantoprazole  40 mg Oral Daily   risperiDONE  0.5 mg Oral QHS   Continuous Infusions:  sodium chloride       Anti-infectives (From admission, onward)    Start     Dose/Rate Route Frequency Ordered Stop   06/16/22 2025  ceFAZolin (ANCEF) 2-4 GM/100ML-% IVPB       Note to Pharmacy: Kayla Wade H: cabinet override  06/16/22 2025 06/17/22 0829   06/16/22 1726  ceFAZolin (ANCEF) 2-4 GM/100ML-% IVPB       Note to Pharmacy: Kayla Wade J: cabinet override      06/16/22 1726 06/17/22 0529   06/16/22 1700  ceFAZolin (ANCEF) IVPB 2g/100 mL premix        2 g 200 mL/hr over 30 Minutes Intravenous Every 6 hours 06/16/22 1456 06/16/22 2238   06/16/22 0811  ceFAZolin (ANCEF) 2-4 GM/100ML-% IVPB       Note to Pharmacy: Kayla Wade A: cabinet override      06/16/22 0811 06/16/22 1805   06/16/22 0600  ceFAZolin (ANCEF) IVPB 2g/100 mL premix        2 g 200 mL/hr over 30 Minutes Intravenous On call to O.R. 06/16/22 0324  06/16/22 1058              Family Communication/Anticipated D/C date and plan/Code Status   DVT prophylaxis: enoxaparin (LOVENOX) injection 40 mg Start: 06/17/22 0800 SCDs Start: 06/16/22 1500     Code Status: Full Code  Family Communication: Plan discussed with family, including,  daughter, at the bedside Disposition Plan: Per orthopedic surgeon          Subjective:   Interval events noted.  No shortness of breath, chest pain or dizziness.  No hip pain.  She feels fine.  Objective:    Vitals:   06/16/22 1956 06/17/22 0134 06/17/22 0524 06/17/22 0847  BP: 121/60 (!) 99/53 (!) 105/54 (!) 103/52  Pulse: 89 78 75 80  Resp: '16  18 18  '$ Temp: (!) 97.3 F (36.3 C) 98.2 F (36.8 C) 99 F (37.2 C) 98.3 F (36.8 C)  TempSrc: Temporal Oral Temporal Temporal  SpO2: 96% 95% 97% 96%  Weight:      Height:       No data found.   Intake/Output Summary (Last 24 hours) at 06/17/2022 0955 Last data filed at 06/16/2022 1752 Gross per 24 hour  Intake 1360 ml  Output 700 ml  Net 660 ml   Filed Weights   06/16/22 0827  Weight: 72.8 kg    Exam:  GEN: NAD SKIN: Chronic multiple hyperpigmented lesions/plaques on her back. EYES: No pallor or icterus ENT: MMM CV: RRR PULM: CTA B ABD: soft, ND, NT, +BS CNS: AAO x 3, non focal EXT: Dressing on left hip surgical wound is clean, dry and intact        Data Reviewed:   I have personally reviewed following labs and imaging studies:  Labs: Labs show the following:   Basic Metabolic Panel: Recent Labs  Lab 06/16/22 2027  NA 130*  K 4.5  CL 102  CO2 18*  GLUCOSE 422*  BUN 18  CREATININE 1.23*  CALCIUM 7.8*   GFR Estimated Creatinine Clearance: 42.2 mL/min (A) (by C-G formula based on SCr of 1.23 mg/dL (H)). Liver Function Tests: No results for input(s): "AST", "ALT", "ALKPHOS", "BILITOT", "PROT", "ALBUMIN" in the last 168 hours. No results for input(s): "LIPASE", "AMYLASE" in the last 168  hours. No results for input(s): "AMMONIA" in the last 168 hours. Coagulation profile No results for input(s): "INR", "PROTIME" in the last 168 hours.  CBC: Recent Labs  Lab 06/17/22 0655  WBC 13.0*  HGB 8.6*  HCT 25.4*  MCV 85.2  PLT 194   Cardiac Enzymes: No results for input(s): "CKTOTAL", "CKMB", "CKMBINDEX", "TROPONINI" in the last 168 hours. BNP (last 3 results) No results for input(s): "PROBNP" in the last 8760 hours. CBG: Recent  Labs  Lab 06/16/22 1856 06/16/22 1921 06/16/22 2115 06/17/22 0024 06/17/22 0749  GLUCAP 447* 450* 351* 273* 226*   D-Dimer: No results for input(s): "DDIMER" in the last 72 hours. Hgb A1c: No results for input(s): "HGBA1C" in the last 72 hours. Lipid Profile: No results for input(s): "CHOL", "HDL", "LDLCALC", "TRIG", "CHOLHDL", "LDLDIRECT" in the last 72 hours. Thyroid function studies: No results for input(s): "TSH", "T4TOTAL", "T3FREE", "THYROIDAB" in the last 72 hours.  Invalid input(s): "FREET3" Anemia work up: No results for input(s): "VITAMINB12", "FOLATE", "FERRITIN", "TIBC", "IRON", "RETICCTPCT" in the last 72 hours. Sepsis Labs: Recent Labs  Lab 06/17/22 0655  WBC 13.0*    Microbiology Recent Results (from the past 240 hour(s))  Surgical pcr screen     Status: None   Collection Time: 06/07/22  1:10 PM   Specimen: Nasal Mucosa; Nasal Swab  Result Value Ref Range Status   MRSA, PCR NEGATIVE NEGATIVE Final   Staphylococcus aureus NEGATIVE NEGATIVE Final    Comment: (NOTE) The Xpert SA Assay (FDA approved for NASAL specimens in patients 75 years of age and older), is one component of a comprehensive surveillance program. It is not intended to diagnose infection nor to guide or monitor treatment. Performed at [facility], [address] [place], [place] [address]   Aerobic/Anaerobic Culture w Gram Stain (surgical/deep wound)     Status: None (Preliminary result)   Collection Time: 06/16/22 12:19 PM    Specimen: PATH Other; Tissue  Result Value Ref Range Status   Specimen Description   Final    HIP LEFT FEMUR Performed at [facility], [address], [place], [place] [address]    Special Requests   Final    NONE Performed at [facility], [place], [place] [address]    Gram Stain NO WBC SEEN NO ORGANISMS SEEN   Final   Culture   Final    NO GROWTH < 24 HOURS Performed at [facility], [place] [address] [place], [place] [address]    Report Status PENDING  Incomplete  Aerobic/Anaerobic Culture w Gram Stain (surgical/deep wound)     Status: None (Preliminary result)   Collection Time: 06/16/22 12:20 PM   Specimen: PATH Other; Tissue  Result Value Ref Range Status   Specimen Description   Final    HIP LEFT HIP ACETABULUM 1 Performed at [facility], [address], [place], [place] [address]    Special Requests   Final    NONE Performed at [facility], [address] [place], [place] [address]    Gram Stain   Final    FEW WBC PRESENT,BOTH PMN AND MONONUCLEAR NO ORGANISMS SEEN    Culture   Final    NO GROWTH < 24 HOURS Performed at [facility], [place] [address], [place], [place] [address]    Report Status PENDING  Incomplete  Aerobic/Anaerobic Culture w Gram Stain (surgical/deep wound)     Status: None (Preliminary result)   Collection Time: 06/16/22 12:20 PM   Specimen: PATH Other; Tissue  Result Value Ref Range Status   Specimen Description   Final    HIP LEFT ACETABULUM 2 Performed at [facility], [address], [place], [place] [address]    Special Requests   Final    NONE Performed at [facility], [address] [place], [place] [address]    Gram Stain NO WBC SEEN NO ORGANISMS SEEN   Final  Culture   Final    NO GROWTH < 24 HOURS Performed at [facility], [place] [address], [place], [place] [address]    Report Status PENDING  Incomplete    Procedures and  diagnostic studies:  DG Pelvis Portable  Result Date: 06/16/2022 CLINICAL DATA:  LEFT hip surgery EXAM: PORTABLE PELVIS 1-2 VIEWS COMPARISON:  Portable exam 1315 hours compared to CT LEFT hip 05/16/2022 FINDINGS: Osseous demineralization. Revision of LEFT hip prosthesis. No acute fracture, dislocation, or bone destruction. SI joints and RIGHT hip joint space preserved. Numerous pelvic phleboliths. IMPRESSION: Post revision of LEFT hip prosthesis. No acute abnormalities. Electronically Signed   By: Lavonia Dana M.D.   On: 06/16/2022 14:24               LOS: 1 day   Aamna Mallozzi  [facility]   Pager on [email or web address]. If 7PM-7AM, please contact night-coverage at [email or web address]     06/17/2022, 9:55 AM

## 2022-06-17 NOTE — Evaluation (Signed)
Occupational Therapy Evaluation Patient Details Name: Kayla Wade MRN: [id number] DOB: 1951/07/26 Today's Date: 06/17/2022   History of Present Illness Pt is a 71 y.o. female s/p L hip revision arthroplasty acetabular component only secondary L hip loose acetabular component s/p L total hip replacement (painful L total hip).  PMH includes htn, HLD, intraparenchymal cyst with shunt, L THR Oct 2022, goiter, h/o craniotomy, elbow sx, SOB, bipolar disorder, bradycardia.   Clinical Impression   Pt seen for OT evaluation this date, POD#1 from above surgery. Pt was independent in all ADLs prior to surgery and is eager to return to PLOF with less pain and improved safety and independence. Pt currently requires minimal assist for LB dressing and bathing while in seated position due to precautions and limited AROM of L hip. Pt able to recall 1/3 posterior total hip precautions at start of session and unable to verbalize how to implement during ADL and mobility. Pt/family instructed in posterior total hip precautions and how to implement, self care skills, falls prevention strategies, home/routines modifications, DME/AE for LB bathing and dressing tasks, and RW mgt/functional mobility training with RW. Pt required supv-CGA for ADL transfers and functional mobility with RW and PRN VC for precautions, supv for toilet transfer with RW. At end of session, pt able to recall 3/3 posterior total hip precautions. Pt/family verbalized understanding of all education/training provided and deny additional needs. Do not anticipate additional OT needs upon discharge.      Recommendations for follow up therapy are one component of a multi-disciplinary discharge planning process, led by the attending physician.  Recommendations may be updated based on patient status, additional functional criteria and insurance authorization.   Follow Up Recommendations  No OT follow up    Assistance Recommended at Discharge Intermittent  Supervision/Assistance  Patient can return home with the following A little help with bathing/dressing/bathroom;Assistance with cooking/housework;Assist for transportation    Functional Status Assessment  Patient has had a recent decline in their functional status and demonstrates the ability to make significant improvements in function in a reasonable and predictable amount of time.  Equipment Recommendations  None recommended by OT    Recommendations for Other Services       Precautions / Restrictions Precautions Precautions: Fall;Posterior Hip Precaution Booklet Issued: Yes (comment) Restrictions Weight Bearing Restrictions: Yes LLE Weight Bearing: Weight bearing as tolerated      Mobility Bed Mobility Overal bed mobility: Needs Assistance Bed Mobility: Supine to Sit     Supine to sit: Min guard, HOB elevated          Transfers Overall transfer level: Needs assistance Equipment used: Rolling walker (2 wheels) Transfers: Sit to/from Stand Sit to Stand: Min guard, Supervision           General transfer comment: good carryover of precautions      Balance Overall balance assessment: Needs assistance Sitting-balance support: No upper extremity supported, Feet supported Sitting balance-Leahy Scale: Good     Standing balance support: No upper extremity supported, During functional activity Standing balance-Leahy Scale: Good Standing balance comment: stood at sink to wash hands wihtout UE support and no LOB                           ADL either performed or assessed with clinical judgement   ADL Overall ADL's : Needs assistance/impaired  General ADL Comments: Pt requires MIN A for LB ADL in order to maintain precautions; family able to provide. Supv-CGA for ADL transfers with RW for precautions.     Vision         Perception     Praxis      Pertinent Vitals/Pain Pain Assessment Pain  Assessment: No/denies pain     Hand Dominance     Extremity/Trunk Assessment Upper Extremity Assessment Upper Extremity Assessment: Overall WFL for tasks assessed   Lower Extremity Assessment Lower Extremity Assessment: LLE deficits/detail LLE Deficits / Details: expected post-op strength, ROM deficits   Cervical / Trunk Assessment Cervical / Trunk Assessment: Normal   Communication Communication Communication: No difficulties   Cognition Arousal/Alertness: Awake/alert Behavior During Therapy: WFL for tasks assessed/performed Overall Cognitive Status: Within Functional Limits for tasks assessed                                       General Comments       Exercises Other Exercises Other Exercises: Pt /family educated in posterior hip precautions and how to maintain during ADL, falls prevention, AE/DME, and home/routines modifications. Handout provided.   Shoulder Instructions      Home Living Family/patient expects to be discharged to:: Private residence Living Arrangements: Other relatives (grandson and his girlfriend; 3 great grandchildren) Available Help at Discharge: Family;Available PRN/intermittently Type of Home: House Home Access: Stairs to enter [employer] of Steps: 2 with L railing plus 1 no railing (holds onto door frame)   Home Layout: Two level;Able to live on main level with bedroom/bathroom;Laundry or work area in basement (pt will have someone doing her laundry)     Bathroom Shower/Tub: Tub/shower unit;Walk-in shower   Bathroom Toilet: Standard     Home Equipment: Conservation officer, nature (2 wheels);Cane - single point;Shower seat;Adaptive equipment Adaptive Equipment: Reacher        Prior Functioning/Environment Prior Level of Function : Independent/Modified Independent             Mobility Comments: pt reports 2 falls in past 6 months (pt fell OOB 1x and off of couch 1x--both when reaching for telephone)           OT Problem List: Decreased strength;Decreased range of motion      OT Treatment/Interventions:      OT Goals(Current goals can be found in the care plan section) Acute Rehab OT Goals Patient Stated Goal: get better in 6wks OT Goal Formulation: All assessment and education complete, DC therapy  OT Frequency:      Co-evaluation              AM-PAC OT "6 Clicks" Daily Activity     Outcome Measure Help from another person eating meals?: None Help from another person taking care of personal grooming?: None Help from another person toileting, which includes using toliet, bedpan, or urinal?: None Help from another person bathing (including washing, rinsing, drying)?: A Little Help from another person to put on and taking off regular upper body clothing?: None Help from another person to put on and taking off regular lower body clothing?: A Little 6 Click Score: 22   End of Session Equipment Utilized During Treatment: Gait belt;Rolling walker (2 wheels)  Activity Tolerance: Patient tolerated treatment well Patient left: in bed;with call bell/phone within reach;with family/visitor present (seated EOB per pt request)  OT Visit Diagnosis: Other abnormalities of gait and mobility (  R26.89)                Time: 4536-4680 OT Time Calculation (min): 25 min Charges:  OT General Charges $OT Visit: 1 Visit OT Evaluation $OT Eval Moderate Complexity: 1 Mod OT Treatments $Self Care/Home Management : 8-22 mins  Ardeth Perfect., MPH, MS, OTR/L ascom [phone] 06/17/22, 9:42 AM

## 2022-06-17 NOTE — Progress Notes (Signed)
Pt was discharged home per MD's order. Iv was discontinued, pt verbalized and family fully understanding of educations and discharged instructions. VS WNL. Pt was escorted on a wheelchair to the visitor's entrance by a hospital volunteer. Continue to monitor.

## 2022-06-17 NOTE — Discharge Summary (Signed)
Physician Discharge Summary  Patient ID: Kayla Wade MRN: [id number] DOB/AGE: 10/21/1950 71 y.o.  Admit date: 06/16/2022 Discharge date: 06/17/2022  Admission Diagnoses:  Mechanical loosening of prosthetic hip (HCC) [Q46.962X, Z96.649]   Discharge Diagnoses: Patient Active Problem List   Diagnosis Date Noted   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   Uncontrolled type 2 diabetes mellitus with hyperglycemia, without long-term current use of insulin (HCC)    Stage 3a chronic kidney disease (HCC)    Coronary artery disease involving native coronary artery of native heart without angina pectoris 05/20/2022   Status post total hip replacement, left 06/24/2021   H/O craniotomy 03/07/2021   Breast lump or mass 01/23/2013   Depression 10/09/2008   SLEEP DISORDER 08/25/2008   INJURY, FINGER 04/28/2008   IMPAIRED FASTING GLUCOSE 01/07/2008   ARACHNOID CYST 11/27/2007   Dizziness and giddiness 08/20/2007   NEPHROLITHIASIS 07/09/2007   GOITER, 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   HYPERLIPIDEMIA 02/01/2007   COMMON MIGRAINE 02/01/2007   Hypertension 02/01/2007   EDEMA 02/01/2007   Hyperlipidemia 02/01/2007    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 female who was admitted 06/16/2022 with a diagnosis of Mechanical loosening of prosthetic hip (HCC) and went to the operating  room on 06/16/2022 and underwent the above named procedures.    Surgeries: Procedure(s): LEFT HIP ACETABULAR REVISION on 06/16/2022 Patient tolerated the surgery well. Taken to PACU where she was stabilized and then transferred to the orthopedic floor.  Started on Lovenox 40 mg q 24 hrs. TEDs and SCDs applied bilaterally. Heels elevated on bed. No evidence of DVT. Negative Homan. Physical therapy started on day #1 for gait training and transfer. OT started day #1 for ADL and assisted devices. Patient with hyperglycemia, IM consulted and patient placed on sliding scale and given IV fluids. IM started patient back on her metformin. Patient will follow up with PCP to discuss hyperglycemia. Patient's IV was d/c on day #1. Patient safely completed all goals with PT. Pain well controlled. VSS. On post op day #1 patient was stable and ready for discharge to home with HHPT.  Implants: Cup 52mm styker multi hole shell (E)    Liner: stryker restoration ADM/MDM 28/48 (42E) with 42mm MDM liner  Stem: unchanged  Head: 28mm ceramic medtacer with 12/14 0mm sleeve    She was given perioperative antibiotics:  Anti-infectives (From admission, onward)    Start     Dose/Rate Route Frequency Ordered Stop   06/16/22 2025  ceFAZolin (ANCEF) 2-4 GM/100ML-% IVPB       Note to Pharmacy: Stefano Gaul H: cabinet override      06/16/22 2025 06/17/22 0829   06/16/22 1726  ceFAZolin (ANCEF) 2-4 GM/100ML-% IVPB  Note to Pharmacy: Gwynneth Aliment J: cabinet override      06/16/22 1726 06/17/22 0529   06/16/22 1700  ceFAZolin (ANCEF) IVPB 2g/100 mL premix        2 g 200 mL/hr over 30 Minutes Intravenous Every 6 hours 06/16/22 1456 06/16/22 2238   06/16/22 0811  ceFAZolin (ANCEF) 2-4 GM/100ML-% IVPB       Note to Pharmacy: Agnes Lawrence A: cabinet override      06/16/22 0811 06/16/22 1805   06/16/22 0600  ceFAZolin (ANCEF) IVPB 2g/100 mL premix        2 g 200 mL/hr over 30 Minutes Intravenous On call to  O.R. 06/16/22 0324 06/16/22 1058     .  She was given sequential compression devices, early ambulation, and Lovenox for DVT prophylaxis.  She benefited maximally from the hospital stay and there were no complications.    Recent vital signs:  Vitals:   06/17/22 0134 06/17/22 0524  BP: (!) 99/53 (!) 105/54  Pulse: 78 75  Resp:  18  Temp: 98.2 F (36.8 C) 99 F (37.2 C)  SpO2: 95% 97%    Recent laboratory studies:  Lab Results  Component Value Date   HGB 8.6 (L) 06/17/2022   HGB 12.7 06/07/2022   HGB 9.3 (L) 06/25/2021   Lab Results  Component Value Date   WBC 13.0 (H) 06/17/2022   PLT 194 06/17/2022   No results found for: "INR" Lab Results  Component Value Date   NA 130 (L) 06/16/2022   K 4.5 06/16/2022   CL 102 06/16/2022   CO2 18 (L) 06/16/2022   BUN 18 06/16/2022   CREATININE 1.23 (H) 06/16/2022   GLUCOSE 422 (H) 06/16/2022    Discharge Medications:   Allergies as of 06/17/2022       Reactions   Tussionex Pennkinetic Er [hydrocod Poli-chlorphe Poli Er] Nausea Only   Zolpidem Other (See Comments)   hallucinations   Erythromycin Base Rash        Medication List     STOP taking these medications    methocarbamol 500 MG tablet Commonly known as: ROBAXIN       TAKE these medications    acetaminophen 500 MG tablet Commonly known as: TYLENOL Take 1,000 mg by mouth every 8 (eight) hours as needed for mild pain.   albuterol 108 (90 Base) MCG/ACT inhaler Commonly known as: VENTOLIN HFA Inhale 2 puffs into the lungs every 6 (six) hours as needed for wheezing or shortness of breath.   atorvastatin 40 MG tablet Commonly known as: LIPITOR Take 40 mg by mouth daily.   celecoxib 200 MG capsule Commonly known as: CeleBREX Take 1 capsule (200 mg total) by mouth 2 (two) times daily for 14 days.   cyanocobalamin 1000 MCG tablet Commonly known as: VITAMIN B12 Take 2,000 mcg by mouth daily.   docusate sodium 100 MG capsule Commonly known as:  COLACE Take 1 capsule (100 mg total) by mouth 2 (two) times daily.   enalapril 20 MG tablet Commonly known as: VASOTEC Take 5 mg by mouth 2 (two) times daily.   enoxaparin 40 MG/0.4ML injection Commonly known as: LOVENOX Inject 0.4 mLs (40 mg total) into the skin daily for 14 days.   famotidine 10 MG tablet Commonly known as: PEPCID Take 10 mg by mouth daily. What changed: Another medication with the same name was removed. Continue taking this medication, and follow the directions you see here.   ferrous fumarate-b12-vitamic C-folic acid capsule Commonly known as:  TRINSICON / FOLTRIN Take 1 capsule by mouth 2 (two) times daily after a meal.   Fluticasone Propionate (Inhal) 100 MCG/ACT Aepb Inhale 2 puffs into the lungs 2 (two) times daily.   hydrochlorothiazide 12.5 MG capsule Commonly known as: MICROZIDE Take 12.5 mg by mouth daily.   magnesium oxide 400 (240 Mg) MG tablet Commonly known as: MAG-OX Take 400 mg by mouth QID.   meclizine 12.5 MG tablet Commonly known as: ANTIVERT Take 1 tablet (12.5 mg total) by mouth 3 (three) times daily as needed for dizziness.   metFORMIN 500 MG 24 hr tablet Commonly known as: GLUCOPHAGE-XR Take 500 mg by mouth 2 (two) times daily.   multivitamin with minerals tablet Take 1 tablet by mouth daily.   polyethylene glycol 17 g packet Commonly known as: MIRALAX / GLYCOLAX Take 17 g by mouth daily as needed for mild constipation.   risperiDONE 0.5 MG tablet Commonly known as: RISPERDAL Take 0.5 mg by mouth at bedtime.   Slow-Mag 64 MG Tbec SR tablet Generic drug: magnesium chloride Take 1 tablet by mouth daily.   traMADol 50 MG tablet Commonly known as: ULTRAM Take 1 tablet (50 mg total) by mouth every 6 (six) hours as needed for moderate pain. What changed: reasons to take this        Diagnostic Studies: DG Pelvis Portable  Result Date: 06/16/2022 CLINICAL DATA:  LEFT hip surgery EXAM: PORTABLE PELVIS 1-2 VIEWS  COMPARISON:  Portable exam 1315 hours compared to CT LEFT hip 05/16/2022 FINDINGS: Osseous demineralization. Revision of LEFT hip prosthesis. No acute fracture, dislocation, or bone destruction. SI joints and RIGHT hip joint space preserved. Numerous pelvic phleboliths. IMPRESSION: Post revision of LEFT hip prosthesis. No acute abnormalities. Electronically Signed   By: Ulyses Southward M.D.   On: 06/16/2022 14:24   CT CORONARY MORPH W/CTA COR W/SCORE W/CA W/CM &/OR WO/CM  Addendum Date: 05/19/2022   ADDENDUM REPORT: 05/19/2022 16:32 EXAM: OVER-READ INTERPRETATION  CT CHEST The following report is an over-read performed by radiologist Dr. [facility] on 05/19/2022. This over-read does not include interpretation of cardiac or coronary anatomy or pathology. The coronary CTA. Interpretation by the cardiologist is attached. COMPARISON:  None. FINDINGS: Normal caliber ascending thoracic aorta.  No dissection. No mediastinal or hilar mass or lymphadenopathy. The esophagus is grossly normal. No acute pulmonary findings. No worrisome pulmonary lesions or pulmonary nodules. No pleural effusion. No significant upper abdominal findings. No significant bony findings. IMPRESSION: No significant extracardiac findings. Electronically Signed   By: Rudie Meyer M.D.   On: 05/19/2022 16:32   Result Date: 05/19/2022 CLINICAL DATA:  Dyspnea on exertion, angina equivalent EXAM: Cardiac/Coronary  CTA TECHNIQUE: The patient was scanned on a Siemens Somatom go.Top scanner. : A retrospective scan was triggered in the descending thoracic aorta. Axial non-contrast 3 mm slices were carried out through the heart. The data set was analyzed on a dedicated work station and scored using the Agatson method. Gantry rotation speed was 330 msecs and collimation was .6 mm. 100mg  of metoprolol, 15mg  ivabradine and 0.8 mg of sl NTG was given. The 3D data set was reconstructed in 5% intervals of the 60-95 % of the R-R cycle.  Diastolic phases were analyzed on a dedicated work station using MPR, MIP and VRT modes. The patient received 75 cc of contrast. FINDINGS: Aorta: Normal size. Aortic root and descending aorta calcifications. No dissection. Aortic Valve:  Trileaflet.  No calcifications. Coronary Arteries:  Normal coronary origin.  Right dominance.  RCA is a dominant artery that gives rise to PDA and PLA. There is no plaque. Left main gives rise to LAD and LCX arteries.  LM has no disease. LAD has non calcified plaque in the proximal segment causing mild stenosis (25-49%). LCX is a non-dominant artery that gives rise to two obtuse marginal branches. There is no plaque. Other findings: Normal pulmonary vein drainage into the left atrium. Normal left atrial appendage without a thrombus. Normal size of the pulmonary artery. IMPRESSION: 1. Coronary calcium score of 0. 2. Normal coronary origin with right dominance. 3. Non calcified plaque causing mild proximal LAD stenosis (25-49%). 4. CAD-RADS 2. Mild non-obstructive CAD (25-49%). Consider non-atherosclerotic causes of chest pain. Consider preventive therapy and risk factor modification. Electronically Signed: By: Debbe Odea M.D. On: 05/19/2022 14:18    Disposition:      Follow-up Information     Evon Slack, PA-C Follow up in 2 week(s).   Specialties: Orthopedic Surgery, Emergency Medicine Contact information: [address] [place] [address] [phone]                  Signed: Patience Musca 06/17/2022, 8:28 AM

## 2022-06-17 NOTE — Progress Notes (Signed)
Physical Therapy Treatment Patient Details Name: Kayla Wade MRN: [id number] DOB: 03-Jan-1951 Today's Date: 06/17/2022   History of Present Illness Pt is a 71 y.o. female s/p L hip revision arthroplasty acetabular component only secondary L hip loose acetabular component s/p L total hip replacement (painful L total hip).  PMH includes htn, HLD, intraparenchymal cyst with shunt, L THR Oct 2022, goiter, h/o craniotomy, elbow sx, SOB, bipolar disorder, bradycardia.    PT Comments    Pt resting in bed upon PT arrival; pt's daughter and pt's boyfriend present; pt agreeable to therapy.  No c/o L hip pain at rest beginning/end of session (mild L hip pain with activity).  During session pt SBA with bed mobility; SBA with transfers; SBA with ambulating 160 feet with RW use; and CGA navigating 4 steps with UE support.  Pt able to verbalize and maintain posterior THP's throughout session.  Reviewed home safety and safe car transfer technique: pt verbalizing appropriate understanding.  Pt appears safe to discharge home today from PT perspective (PA and pt's nurse notified).  Pt reporting no questions/concerns for home discharge today.   Recommendations for follow up therapy are one component of a multi-disciplinary discharge planning process, led by the attending physician.  Recommendations may be updated based on patient status, additional functional criteria and insurance authorization.  Follow Up Recommendations  Home health PT     Assistance Recommended at Discharge Intermittent Supervision/Assistance  Patient can return home with the following A little help with walking and/or transfers;A little help with bathing/dressing/bathroom;Assistance with cooking/housework;Assist for transportation;Help with stairs or ramp for entrance   Equipment Recommendations  Rolling walker (2 wheels);BSC/3in1    Recommendations for Other Services OT consult     Precautions / Restrictions Precautions Precautions:  Fall;Posterior Hip Precaution Booklet Issued: Yes (comment) Restrictions Weight Bearing Restrictions: Yes LLE Weight Bearing: Weight bearing as tolerated     Mobility  Bed Mobility Overal bed mobility: Needs Assistance Bed Mobility: Supine to Sit, Sit to Supine     Supine to sit: Supervision, HOB elevated Sit to supine: Supervision, HOB elevated   General bed mobility comments: mild increased effort to perform on own; able to maintain posterior THP's    Transfers Overall transfer level: Needs assistance Equipment used: Rolling walker (2 wheels) Transfers: Sit to/from Stand Sit to Stand: Supervision           General transfer comment: steady transfer from bed; able to maintain posterior THP's    Ambulation/Gait Ambulation/Gait assistance: Supervision Gait Distance (Feet): 160 Feet Assistive device: Rolling walker (2 wheels)   Gait velocity: decreased     General Gait Details: antalgic; mild decreased stance time L LE; steady with RW use   Stairs Stairs: Yes Stairs assistance: Min guard Stair Management: One rail Left Number of Stairs: 4 General stair comments: ascended/descended 3 steps with L railing and 1 step with L railing simulating doorframe hold; initial vc's for technique; steady   Wheelchair Mobility    Modified Rankin (Stroke Patients Only)       Balance Overall balance assessment: Needs assistance Sitting-balance support: No upper extremity supported, Feet supported Sitting balance-Leahy Scale: Normal Sitting balance - Comments: steady sitting reaching outside BOS   Standing balance support: Bilateral upper extremity supported, During functional activity, Reliant on assistive device for balance   Standing balance comment: steady ambulating with RW use  Cognition Arousal/Alertness: Awake/alert Behavior During Therapy: WFL for tasks assessed/performed Overall Cognitive Status: Within Functional Limits  for tasks assessed                                          Exercises Total Joint Exercises Ankle Circles/Pumps: AROM, Strengthening, Both, 10 reps, Supine Quad Sets: AROM, Strengthening, Left, 10 reps, Supine Gluteal Sets: AROM, Strengthening, Both, 10 reps, Supine Towel Squeeze: AROM, Strengthening, Both, 10 reps, Supine (pillow between pt's knees) Short Arc Quad: AROM, Strengthening, Left, 10 reps, Supine Heel Slides: AROM, Strengthening, Left, 10 reps, Supine Hip ABduction/ADduction: AAROM, Strengthening, Left, 10 reps, Supine    General Comments General comments (skin integrity, edema, etc.): no drainage noted L hip dressing beginning/end of session.      Pertinent Vitals/Pain Pain Assessment Pain Assessment: No/denies pain Pain Score: 0-No pain Pain Location: L hip Pain Intervention(s): Limited activity within patient's tolerance, Monitored during session, Premedicated before session, Ice applied Vitals (HR and O2 on room air) stable and WFL throughout treatment session.    Home Living      Prior Function            PT Goals (current goals can now be found in the care plan section) Acute Rehab PT Goals Patient Stated Goal: to improve mobility and pain PT Goal Formulation: With patient Time For Goal Achievement: 06/30/22 Potential to Achieve Goals: Good Progress towards PT goals: Progressing toward goals    Frequency    BID      PT Plan Current plan remains appropriate    Co-evaluation              AM-PAC PT "6 Clicks" Mobility   Outcome Measure  Help needed turning from your back to your side while in a flat bed without using bedrails?: A Little Help needed moving from lying on your back to sitting on the side of a flat bed without using bedrails?: A Little Help needed moving to and from a bed to a chair (including a wheelchair)?: A Little Help needed standing up from a chair using your arms (e.g., wheelchair or bedside chair)?:  A Little Help needed to walk in hospital room?: A Little Help needed climbing 3-5 steps with a railing? : A Little 6 Click Score: 18    End of Session Equipment Utilized During Treatment: Gait belt Activity Tolerance: Patient tolerated treatment well Patient left: in bed;with call bell/phone within reach;with family/visitor present (pillow between pt's knees) Nurse Communication: Mobility status;Precautions;Weight bearing status;Other (comment) (pt ready to discharge from PT standpoint) PT Visit Diagnosis: Other abnormalities of gait and mobility (R26.89);Muscle weakness (generalized) (M62.81);History of falling (Z91.81);Pain Pain - Right/Left: Left Pain - part of body: Hip     Time: 5701-7793 PT Time Calculation (min) (ACUTE ONLY): 38 min  Charges:  $Gait Training: 23-37 mins $Therapeutic Exercise: 8-22 mins                     Leitha Bleak, PT 06/17/22, 1:23 PM

## 2022-06-17 NOTE — Discharge Instructions (Signed)
Instructions after Posterior Total Hip Revision of Acetabular Component        Dr. Serita Butcher., M.D.      Dept. of [facility]  [place], [place]  [address]  Phone: [phone]   Fax: [phone]    DIET: Drink plenty of non-alcoholic fluids. Resume your normal diet. Include foods high in fiber.  ACTIVITY:  You may use crutches or a walker with weight-bearing as tolerated, unless instructed otherwise. You may be weaned off of the walker or crutches by your Physical Therapist.  Do NOT reach below the level of your knees or cross your legs until allowed.    Continue doing gentle exercises. Exercising will reduce the pain and swelling, increase motion, and prevent muscle weakness.   Please continue to use the TED compression stockings for 2 weeks. You may remove the stockings at night, but should reapply them in the morning. Do not drive or operate any equipment until instructed.  WOUND CARE:  Continue to use ice packs periodically to reduce pain and swelling. You may shower with honeycomb dressing 3 days after surgery. Do not submerge incision site under water. Remove honeycomb dressing 7 days after surgery and allow dermabond to fall off on its own.   MEDICATIONS: You may resume your regular medications. Please take the pain medication as prescribed on the medication list. Do not take pain medication on an empty stomach. You have been given a prescription for a blood thinner to prevent blood clots. Please take the medication as instructed. (NOTE: After completing a 2 week course of Lovenox, take one Enteric-coated 81 mg aspirin twice a day.) Pain medications and iron supplements can cause constipation. Use a stool softener (Senokot or Colace) on a daily basis and a laxative (dulcolax or miralax) as needed. Do not drive or drink alcoholic beverages when taking pain medications.  CALL THE OFFICE FOR: Temperature  above 101 degrees Excessive bleeding or drainage on the dressing. Excessive swelling, coldness, or paleness of the toes. Persistent nausea and vomiting.  FOLLOW-UP:  You should have an appointment to return to the office in 2 weeks after surgery. Arrangements have been made for continuation of Physical Therapy (either home therapy or outpatient therapy).

## 2022-06-17 NOTE — Progress Notes (Signed)
   Subjective: 1 Day Post-Op Procedure(s) (LRB): LEFT HIP ACETABULAR REVISION (Left) Patient reports pain as 0 on 0-10 scale.   Patient is well, and has had no acute complaints or problems Denies any CP, SOB, ABD pain. We will continue therapy today.  Plan is to go Home after hospital stay.  Objective: Vital signs in last 24 hours: Temp:  [96.4 F (35.8 C)-99 F (37.2 C)] 99 F (37.2 C) (10/13 0524) Pulse Rate:  [66-92] 75 (10/13 0524) Resp:  [11-19] 18 (10/13 0524) BP: (99-128)/(47-70) 105/54 (10/13 0524) SpO2:  [92 %-100 %] 97 % (10/13 0524) Weight:  [72.8 kg] 72.8 kg (10/12 0827)  Intake/Output from previous day: 10/12 0701 - 10/13 0700 In: 1360 [P.O.:360; I.V.:700; IV Piggyback:300] Out: 700 [Urine:550; Blood:150] Intake/Output this shift: No intake/output data recorded.  Recent Labs    06/17/22 0655  HGB 8.6*   Recent Labs    06/17/22 0655  WBC 13.0*  RBC 2.98*  HCT 25.4*  PLT 194   Recent Labs    06/16/22 2027  NA 130*  K 4.5  CL 102  CO2 18*  BUN 18  CREATININE 1.23*  GLUCOSE 422*  CALCIUM 7.8*   No results for input(s): "LABPT", "INR" in the last 72 hours.  EXAM General - Patient is Alert, Appropriate, and Oriented Extremity - Neurologically intact Sensation intact distally Intact pulses distally Dorsiflexion/Plantar flexion intact No cellulitis present Compartment soft Dressing - dressing C/D/I and no drainage Motor Function - intact, moving foot and toes well on exam.   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   Depression    High cholesterol    History of kidney stones    h/o   Hypertension    Low magnesium level    PAC (premature atrial contraction)    PONV (postoperative nausea and vomiting)    nausea only   PVC's (premature ventricular contractions)    Type 2 diabetes mellitus  (HCC)     Assessment/Plan:   1 Day Post-Op Procedure(s) (LRB): LEFT HIP ACETABULAR REVISION (Left) Principal Problem:   Mechanical loosening of prosthetic hip (HCC) Active Problems:   Depression   Hypertension   Uncontrolled type 2 diabetes mellitus with hyperglycemia, without long-term current use of insulin (HCC)   Coronary artery disease involving native coronary artery of native heart without angina pectoris   Hyperlipidemia   Stage 3a chronic kidney disease (HCC)  Estimated body mass index is 27.55 kg/m as calculated from the following:   Height as of this encounter: '5\' 4"'$  (1.626 m).   Weight as of this encounter: 72.8 kg. Advance diet Up with therapy, WBAT. Posterior hip precautions Acute post op blood loss anemia - Hgb 8.6. start iron supplement.  VSS - BP soft. Will hold BP meds this am and give 250cc bolus NS Patients pain well controlled. No complaints this am. Anticipate dc to home with HHPT today pending completion of PT goals  DVT Prophylaxis - Lovenox, TED hose, and SCDs Weight-Bearing as tolerated to left leg   T. Rachelle Hora, PA-C [place] 06/17/2022, 8:11 AM

## 2022-06-21 LAB — AEROBIC/ANAEROBIC CULTURE W GRAM STAIN (SURGICAL/DEEP WOUND)
Culture: NO GROWTH
Culture: NO GROWTH
Culture: NO GROWTH
Gram Stain: NONE SEEN
Gram Stain: NONE SEEN

## 2022-07-12 ENCOUNTER — Emergency Department: Payer: Medicare Other

## 2022-07-12 ENCOUNTER — Emergency Department
Admission: EM | Admit: 2022-07-12 | Discharge: 2022-07-12 | Disposition: A | Discharge status: Home / Self Care | Payer: Medicare Other | Attending: Emergency Medicine | Admitting: Emergency Medicine

## 2022-07-12 DIAGNOSIS — I10 Essential (primary) hypertension: Secondary | ICD-10-CM | POA: Diagnosis not present

## 2022-07-12 DIAGNOSIS — Z79899 Other long term (current) drug therapy: Secondary | ICD-10-CM | POA: Diagnosis not present

## 2022-07-12 DIAGNOSIS — R791 Abnormal coagulation profile: Secondary | ICD-10-CM | POA: Insufficient documentation

## 2022-07-12 DIAGNOSIS — R42 Dizziness and giddiness: Secondary | ICD-10-CM | POA: Insufficient documentation

## 2022-07-12 DIAGNOSIS — E119 Type 2 diabetes mellitus without complications: Secondary | ICD-10-CM | POA: Diagnosis not present

## 2022-07-12 DIAGNOSIS — H538 Other visual disturbances: Secondary | ICD-10-CM | POA: Insufficient documentation

## 2022-07-12 LAB — CBC
HCT: 32.2 % — ABNORMAL LOW (ref 36.0–46.0)
Hemoglobin: 10.3 g/dL — ABNORMAL LOW (ref 12.0–15.0)
MCH: 27.5 pg (ref 26.0–34.0)
MCHC: 32 g/dL (ref 30.0–36.0)
MCV: 85.9 fL (ref 80.0–100.0)
Platelets: 409 10*3/uL — ABNORMAL HIGH (ref 150–400)
RBC: 3.75 MIL/uL — ABNORMAL LOW (ref 3.87–5.11)
RDW: 11.9 % (ref 11.5–15.5)
WBC: 9.5 10*3/uL (ref 4.0–10.5)
nRBC: 0 % (ref 0.0–0.2)

## 2022-07-12 LAB — COMPREHENSIVE METABOLIC PANEL
ALT: 27 U/L (ref 0–44)
AST: 37 U/L (ref 15–41)
Albumin: 3.6 g/dL (ref 3.5–5.0)
Alkaline Phosphatase: 107 U/L (ref 38–126)
Anion gap: 10 (ref 5–15)
BUN: 20 mg/dL (ref 8–23)
CO2: 26 mmol/L (ref 22–32)
Calcium: 9.3 mg/dL (ref 8.9–10.3)
Chloride: 100 mmol/L (ref 98–111)
Creatinine, Ser: 1.06 mg/dL — ABNORMAL HIGH (ref 0.44–1.00)
GFR, Estimated: 57 mL/min — ABNORMAL LOW (ref >60)
Glucose, Bld: 134 mg/dL — ABNORMAL HIGH (ref 70–99)
Potassium: 4.3 mmol/L (ref 3.5–5.1)
Sodium: 136 mmol/L (ref 135–145)
Total Bilirubin: 0.8 mg/dL (ref 0.3–1.2)
Total Protein: 7.6 g/dL (ref 6.5–8.1)

## 2022-07-12 LAB — DIFFERENTIAL
Abs Immature Granulocytes: 0.04 10*3/uL (ref 0.00–0.07)
Basophils Absolute: 0.1 10*3/uL (ref 0.0–0.1)
Basophils Relative: 1 %
Eosinophils Absolute: 0.1 10*3/uL (ref 0.0–0.5)
Eosinophils Relative: 1 %
Immature Granulocytes: 0 %
Lymphocytes Relative: 33 %
Lymphs Abs: 3.1 10*3/uL (ref 0.7–4.0)
Monocytes Absolute: 0.8 10*3/uL (ref 0.1–1.0)
Monocytes Relative: 9 %
Neutro Abs: 5.3 10*3/uL (ref 1.7–7.7)
Neutrophils Relative %: 56 %

## 2022-07-12 LAB — URINALYSIS, ROUTINE W REFLEX MICROSCOPIC
Bilirubin Urine: NEGATIVE
Glucose, UA: NEGATIVE mg/dL
Hgb urine dipstick: NEGATIVE
Ketones, ur: NEGATIVE mg/dL
Leukocytes,Ua: NEGATIVE
Nitrite: NEGATIVE
Protein, ur: NEGATIVE mg/dL
Specific Gravity, Urine: 1.03 (ref 1.005–1.030)
pH: 5 (ref 5.0–8.0)

## 2022-07-12 LAB — APTT: aPTT: 26 seconds (ref 24–36)

## 2022-07-12 LAB — PROTIME-INR
INR: 1.1 (ref 0.8–1.2)
Prothrombin Time: 13.7 seconds (ref 11.4–15.2)

## 2022-07-12 LAB — TROPONIN I (HIGH SENSITIVITY)
Troponin I (High Sensitivity): 4 ng/L (ref <18)
Troponin I (High Sensitivity): 4 ng/L (ref <18)

## 2022-07-12 LAB — ETHANOL: Alcohol, Ethyl (B): <10 mg/dL (ref <10)

## 2022-07-12 MED ORDER — IOHEXOL 350 MG/ML SOLN
75.0000 mL | Freq: Once | INTRAVENOUS | Status: AC | PRN
  Administered 2022-07-12: 75 mL via INTRAVENOUS

## 2022-07-12 MED ORDER — MECLIZINE HCL 25 MG PO TABS: 25.0000 mg | ORAL_TABLET | Freq: Three times a day (TID) | ORAL | 0 refills | Status: AC | PRN

## 2022-07-12 MED ORDER — MECLIZINE HCL 25 MG PO TABS
25.0000 mg | ORAL_TABLET | Freq: Once | ORAL | Status: AC
  Administered 2022-07-12: 25 mg via ORAL
  Filled 2022-07-12: qty 1

## 2022-07-12 NOTE — ED Provider Triage Note (Signed)
  Emergency Medicine Provider Triage Evaluation Note  Kayla Wade , a 71 y.o.female,  was evaluated in triage.  Pt complains of dizziness.  Patient states that she began feeling dizziness yesterday at approximately 1000 while she was at physical therapy.  Advised her to be evaluated at the emergency department to rule out stroke.  She states that she feels fine whenever she is at rest, however when she stands up she feels very lightheaded.  In addition endorses some numbness in both upper extremities.  Denies any other symptoms.   Review of Systems  Positive: Paresthesias in upper extremities, lightheadedness Negative: Denies fever, chest pain, vomiting  Physical Exam   Vitals:   07/12/22 1441  BP: 131/67  Pulse: 65  Resp: 16  Temp: 99.2 F (37.3 C)  SpO2: 100%   Gen:   Awake, no distress   Resp:  Normal effort  MSK:   Moves extremities without difficulty  Other:  No facial droop, no weakness in upper or lower extremities.  Medical Decision Making  Given the patient's initial medical screening exam, the following diagnostic evaluation has been ordered. The patient will be placed in the appropriate treatment space, once one is available, to complete the evaluation and treatment. I have discussed the plan of care with the patient and I have advised the patient that an ED physician or mid-level practitioner will reevaluate their condition after the test results have been received, as the results may give them additional insight into the type of treatment they may need.    Diagnostics: Head CT, labs, EKG  Treatments: none immediately   Teodoro Spray, [place] 07/12/22 1543

## 2022-07-12 NOTE — ED Triage Notes (Signed)
Pt sts that she has home physical therapy and they advised her that she needs to contact her MD as she has possibly had a stroke. Pt sts that her dizziness has been going on since yesterday at 1000.

## 2022-07-12 NOTE — Discharge Instructions (Addendum)
Your chest x-ray was reassuring we have started you on some medication to help with vertigo.  Please follow-up with the ENT doctor.  We have done an MRI and a CT scan to rule out any obvious cause related to the neurological reason for these episodes so I suspect that it could be related to more to vertigo or an inner ear issue however if your symptoms are changing or worsening you should always return to the ER for repeat evaluation because we cannot predict the future   IMPRESSION: 1. Negative for intracranial large vessel occlusion. 2. No significant carotid or vertebral artery stenosis in the neck. 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 3. 2.7 cm left thyroid nodule. 1 cm right upper pole nodule. Recommend thyroid ultrasound (ref: J Am Coll Radiol. 2015 Feb;12(2): 143-50).

## 2022-07-12 NOTE — ED Notes (Signed)
Patient is back from MRI. Introduced myself to patient and family at bedside. Patient is laying bed. Patient reports no chest pain or shortness of breath. Declines ay other pain. Patient states is not having difficulty speaking and her vision is not blurry at this time.

## 2022-07-12 NOTE — ED Provider Notes (Signed)
[facility] Provider Note    Event Date/Time   First MD Initiated Contact with Patient 07/12/22 1749     (approximate)   History   Dizziness   HPI  Kayla Wade is a 71 y.o. female who comes in for sudden onset of difficulties with balance and blurred vision that occurred around 10 AM yesterday.  Patient has a history of diabetes, hypertension, hyperlipidemia.  She was recently on a course of antibiotics for bronchitis.  She does report having some intermittent vertigo as well that she has been taking meclizine for.  However she is never had difficulties with her ambulation or the vision changes.   she reports that the symptoms started yesterday patient reports taking aspirin daily.  She reports no significant symptoms when laying down but when she tries to get up and walk she has her symptoms  Physical Exam   Triage Vital Signs: ED Triage Vitals  Enc Vitals Group     BP 07/12/22 1441 131/67     Pulse Rate 07/12/22 1441 65     Resp 07/12/22 1441 16     Temp 07/12/22 1441 99.2 F (37.3 C)     Temp Source 07/12/22 1441 Oral     SpO2 07/12/22 1441 100 %     Weight 07/12/22 1442 159 lb (72.1 kg)     Height 07/12/22 1442 5' 4.5" (1.638 m)     Head Circumference --      Peak Flow --      Pain Score 07/12/22 1455 0     Pain Loc --      Pain Edu? --      Excl. in [place]? --     Most recent vital signs: Vitals:   07/12/22 1441 07/12/22 1800  BP: 131/67 (!) 156/70  Pulse: 65 62  Resp: 16 18  Temp: 99.2 F (37.3 C)   SpO2: 100% 100%     General: Awake, no distress.  CV:  Good peripheral perfusion.  Resp:  Normal effort.  Abd:  No distention.  Other:  Cranial nerves II through XII appear intact however finger-to-nose when she is trying to put it on her nose she has to focus her eyes inward bilaterally to get her finger to hit her nose.  Her peripheral vision however does seem intact extraocular movements are intact.  Patient is hesitantly ambulatory  without assistance but states that she just feels very off with trying to walk  ED Results / Procedures / Treatments   Labs (all labs ordered are listed, but only abnormal results are displayed) Labs Reviewed  CBC - Abnormal; Notable for the following components:      Result Value   RBC 3.75 (*)    Hemoglobin 10.3 (*)    HCT 32.2 (*)    Platelets 409 (*)    All other components within normal limits  COMPREHENSIVE METABOLIC PANEL - Abnormal; Notable for the following components:   Glucose, Bld 134 (*)    Creatinine, Ser 1.06 (*)    GFR, Estimated 57 (*)    All other components within normal limits  PROTIME-INR  APTT  DIFFERENTIAL  ETHANOL  I-STAT CREATININE, ED     EKG  My interpretation of EKG:  Normal sinus rate of 71 without any ST elevation or T wave inversions, normal intervals  RADIOLOGY I have reviewed the CT head personally and interpreted no evidence of intracranial hemorrhage patient does have a shunt catheter noted  PROCEDURES:  Critical  Care performed: No  .1-3 Lead EKG Interpretation  Performed by: Vanessa [place], MD Authorized by: Vanessa Vanderbilt, MD     Interpretation: normal     ECG rate:  60   ECG rate assessment: normal     Rhythm: sinus rhythm     Ectopy: none     Conduction: normal      MEDICATIONS ORDERED IN ED: Medications - No data to display   IMPRESSION / MDM / [place] / ED COURSE  I reviewed the triage vital signs and the nursing notes.   Patient's presentation is most consistent with acute presentation with potential threat to life or bodily function.   Differential includes vertigo, stroke, dissection, ACS.  Given the changes with possible vision and with ambulation will get CTA to ensure there is no evidence of any stenosis or dissection as well as a MRI to rule out any stroke.  EtOH negative.  Coags normal.  CBC shows stable the similar hemoglobin actually uptrending from 3 weeks ago.  CMP shows stable  creatinine  MRI brain was negative with stable cystic lesion therefore do not feel that this is related to the cyst.  Her shunt appears in place but they wanted a chest x-ray for possible nodule.  This was reassuring.  CT angio showed some concern for some mild fibromuscular dysplasia which I have discussed with patient she can follow-up with her primary care doctor as well as a nodule nodule which she is aware of.  Patient reports feeling better and feeling comfortable with discharge home she is amatory better after the meclizine requesting a prescription for this.  She already has ENT follow-up on the 20th.  She understand that if symptoms are changing or worsening to come back to the emergency room but at this time she denies any vision changes or numbness, tingling and her dizziness seems to be much improved with the meclizine.  I considered admission discussed with patient but she reports feeling much better and ambulatory and is requesting discharge home  The patient is on the cardiac monitor to evaluate for evidence of arrhythmia and/or significant heart rate changes.      FINAL CLINICAL IMPRESSION(S) / ED DIAGNOSES   Final diagnoses:  Vertigo  Dizziness     Rx / DC Orders   ED Discharge Orders     None        Note:  This document was prepared using Dragon voice recognition software and may include unintentional dictation errors.   Vanessa Crane, MD 07/12/22 2248

## 2022-07-19 ENCOUNTER — Other Ambulatory Visit: Payer: Self-pay | Admitting: Internal Medicine

## 2022-07-19 DIAGNOSIS — Z1231 Encounter for screening mammogram for malignant neoplasm of breast: Secondary | ICD-10-CM

## 2022-08-31 ENCOUNTER — Ambulatory Visit
Admission: RE | Admit: 2022-08-31 | Discharge: 2022-08-31 | Disposition: A | Discharge status: Home / Self Care | Payer: Medicare Other | Source: Ambulatory Visit | Attending: Internal Medicine | Admitting: Internal Medicine

## 2022-08-31 DIAGNOSIS — Z1231 Encounter for screening mammogram for malignant neoplasm of breast: Secondary | ICD-10-CM | POA: Diagnosis present

## 2022-11-23 ENCOUNTER — Ambulatory Visit
Admission: RE | Admit: 2022-11-23 | Discharge: 2022-11-23 | Disposition: A | Discharge status: Home / Self Care | Payer: Medicare Other | Source: Ambulatory Visit | Attending: Otolaryngology | Admitting: Otolaryngology

## 2022-11-23 DIAGNOSIS — E041 Nontoxic single thyroid nodule: Secondary | ICD-10-CM | POA: Diagnosis not present

## 2023-02-08 ENCOUNTER — Other Ambulatory Visit: Payer: Self-pay | Admitting: Internal Medicine

## 2023-02-08 DIAGNOSIS — R748 Abnormal levels of other serum enzymes: Secondary | ICD-10-CM

## 2023-02-14 ENCOUNTER — Ambulatory Visit
Admission: RE | Admit: 2023-02-14 | Discharge: 2023-02-14 | Disposition: A | Discharge status: Home / Self Care | Payer: Medicare Other | Source: Ambulatory Visit | Attending: Internal Medicine | Admitting: Internal Medicine

## 2023-02-14 DIAGNOSIS — R748 Abnormal levels of other serum enzymes: Secondary | ICD-10-CM | POA: Insufficient documentation

## 2023-03-15 ENCOUNTER — Other Ambulatory Visit: Payer: Self-pay | Admitting: Student

## 2023-03-15 DIAGNOSIS — R42 Dizziness and giddiness: Secondary | ICD-10-CM

## 2023-03-15 DIAGNOSIS — R2689 Other abnormalities of gait and mobility: Secondary | ICD-10-CM

## 2023-03-23 ENCOUNTER — Ambulatory Visit
Admission: RE | Admit: 2023-03-23 | Discharge: 2023-03-23 | Disposition: A | Discharge status: Home / Self Care | Payer: Medicare Other | Source: Ambulatory Visit | Attending: Student | Admitting: Student

## 2023-03-23 DIAGNOSIS — R2689 Other abnormalities of gait and mobility: Secondary | ICD-10-CM | POA: Diagnosis not present

## 2023-03-23 DIAGNOSIS — R42 Dizziness and giddiness: Secondary | ICD-10-CM | POA: Diagnosis present

## 2023-03-30 ENCOUNTER — Ambulatory Visit: Payer: Medicare Other | Attending: Neurology

## 2023-03-30 DIAGNOSIS — R42 Dizziness and giddiness: Secondary | ICD-10-CM | POA: Insufficient documentation

## 2023-03-30 NOTE — Therapy (Signed)
OUTPATIENT PHYSICAL THERAPY VESTIBULAR EVALUATION  Patient Name: Kayla Wade MRN: [id number] DOB:Apr 20, 1951, 72 y.o., female Today's Date: 03/30/2023  END OF SESSION:  PT End of Session - 03/30/23 1120     Visit Number 1    Number of Visits 9    Date for PT Re-Evaluation 05/25/23    Authorization Type eval: 03/30/23    PT Start Time 1135    PT Stop Time 1220    PT Time Calculation (min) 45 min    Activity Tolerance Patient tolerated treatment well    Behavior During Therapy WFL for tasks assessed/performed            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   Depression    High cholesterol    History of kidney stones    h/o   Hypertension    Low magnesium level    PAC (premature atrial contraction)    PONV (postoperative nausea and vomiting)    nausea only   PVC's (premature ventricular contractions)    Type 2 diabetes mellitus (HCC)    Past Surgical History:  Procedure Laterality Date   ABDOMINAL HYSTERECTOMY     partial   ACETABULAR REVISION Left 06/16/2022   Procedure: LEFT HIP ACETABULAR REVISION;  Surgeon: Reinaldo Berber, MD;  Location: [facility] ORS;  Service: Orthopedics;  Laterality: Left;   BRAIN SURGERY  2010   due to cyst-done at [facility]-Pt has shunt   BREAST BIOPSY Left 01/22/2013   BENIGN BREAST TISSUE WITH FIBROCYSTIC CHANGES INCLUDING USUAL   BREAST SURGERY Right 1998   right breast biopsy   CHOLECYSTECTOMY     COLONOSCOPY  2006   [facility], Glennie Hawk, MD   COLONOSCOPY WITH PROPOFOL N/A 04/11/2018   Procedure: COLONOSCOPY WITH PROPOFOL;  Surgeon: Earline Mayotte, MD;  Location: [facility] ENDOSCOPY;  Service: Endoscopy;  Laterality: N/A;   ELBOW SURGERY Right    LASIK     TONSILLECTOMY     ADENOIDS X 2   TOTAL HIP ARTHROPLASTY Left 06/24/2021   Procedure: TOTAL HIP ARTHROPLASTY ANTERIOR  APPROACH;  Surgeon: Kennedy Bucker, MD;  Location: [facility] ORS;  Service: Orthopedics;  Laterality: Left;   TUBAL LIGATION  1976   Patient Active Problem List   Diagnosis Date Noted   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   Uncontrolled type 2 diabetes mellitus with hyperglycemia, without long-term current use of insulin (HCC)    Stage 3a chronic kidney disease (HCC)    Coronary artery disease involving native coronary artery of native heart without angina pectoris 05/20/2022   Status post total hip replacement, 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/2021   H/O craniotomy 03/07/2021   Breast lump or mass 01/23/2013   Depression 10/09/2008   SLEEP DISORDER 08/25/2008   INJURY, FINGER 04/28/2008   IMPAIRED FASTING GLUCOSE 01/07/2008   ARACHNOID CYST 11/27/2007   Dizziness and giddiness 08/20/2007   NEPHROLITHIASIS 07/09/2007   GOITER, 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   HYPERLIPIDEMIA 02/01/2007   COMMON MIGRAINE 02/01/2007   Hypertension 02/01/2007   EDEMA 02/01/2007   Hyperlipidemia 02/01/2007   PCP: [facility]  REFERRING PROVIDER: Morene Crocker, MD   REFERRING DIAG: R42 (ICD-10-CM) - Dizziness and giddiness   RATIONALE FOR EVALUATION AND TREATMENT: Rehabilitation  THERAPY DIAG: Dizziness and giddiness  ONSET DATE: June 13, 2022  FOLLOW-UP APPT SCHEDULED WITH REFERRING PROVIDER: No    SUBJECTIVE:   Chief Complaint:  Dizziness/lightheadedness  Pertinent History Patient with PMH significant for HTN, CAD, DMII, HLD, BPPV, macular degeneration (central vision loss), and craniotomy with shunt placement (2009) who was referred to physical therapy by neurology due to dizziness. Initial dizziness/vertigo symptoms began on June 13, 2022, three days before her left THA revision surgery. She went to urgent care and was diagnosed with "vertigo," was prescribed meclizine, and given exercises to do at home. Her symptoms eventually resolved however they returned in the spring and  continued for three straight days. She saw [facility] and was diagnosed with BPPV. She returned for 3-4 treatments and achieved full resolution. Pt had an audiogram at that time and was told she has some L sided hearing loss. Per patient, ENT was planning to perform a VNG study as well but due to improvement in symptoms it was never performed. She was also referred to neurology at that time but didn't schedule an appointment due to improvement. Symptoms returned again over the summer and so pt scheduled an appointment to see neurology. The neurologist ordered right and left leg EMG (pt waiting to schedule), routine EEG (waiting to schedule), and MRI brain without contrast. She had the MRI on 03/23/23 however the results are still not reported in the system. She saw a cardiologist and states that she had a Holter monitor over the summer without any significant findings except some PACs/PVCs. Pt also attributes symptom improvement to discontinuing hydrochlorothiazide and beta-blockers however eventually the symptoms returned (still off these medications). Currently she describes her symptoms as feeling "lightheaded like I'm going to pass out." She reports 2 brief episodes of LOC, the most recent episode occurring in early 2024 when she passed out in the shower. She denies any bowel/bladder loss or postictal state with LOC. She also denies any headaches, nausea, or vomiting.  Symptoms can occur with position changes but also when pt is sitting at rest watching television. Pt believes that she had a similar episode in 2022 as well but has difficulty remembering.   CT Angio Head/Neck 07/12/22:  IMPRESSION: 1. Negative for intracranial large vessel occlusion. 2. No significant carotid or vertebral artery stenosis in the neck. 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  Description of dizziness: lightheadedness, vision fading (2 episodes of LOC); Frequency: last  symptoms occurred approximately 1 month ago, no symptoms since that time;  Duration: days Symptom nature: constant, not related to position changes Progression of symptoms since onset: worse History of similar episodes: Yes  Provocative Factors: spontaneous Easing Factors: meclizine, waiting for symptoms to pass  Auditory complaints (tinnitus, pain, drainage, hearing loss, aural fullness): Yes, aural fullness bilaterally, R tinnitus (chronic x years); Vision changes (diplopia, visual field loss, recent changes, recent eye exam): Yes, central vision loss (history of macular degeneration); Chest pain/palpitations: No History of head injury/concussion: No Stress/anxiety: No Migraines/headaches: Yes, years ago but no issues since shunt placement; Nausea/vomiting: No Numbness/tingling: Yes, LUE tingling x 1-2 years; Focal weakness: No Dysarthria/dysphagia/drop attacks: Yes, one episode of slurred speech during the episode;  Has patient fallen in last 6 months? No Pertinent pain: Yes, chronic L hip pain; Dominant hand: right Imaging: Yes, recent MRI but results still not available in system, see history; Prior level of function: Independent Occupational demands: Not working since brain surgery in 2009 (on disability); Hobbies: watching TV, going to the beach, working in the yard, spending  time with family (great grandchildren)  Red Flags: Positive: night sweats recently, Negative for bowel/bladder changes, saddle paresthesia, personal history of cancer, unexplained weight loss/gain  PRECAUTIONS: None  WEIGHT BEARING RESTRICTIONS No  LIVING ENVIRONMENT: Lives with: lives with their partner, boyfriend Lives in: House/apartment, 2 level house with finished basement. Washer/dryer in basement, bedroom on main level; Stairs: full flight of stairs (12), L handrail when descending; Has following equipment at home: Single point cane, Walker - 2 wheeled, shower chair, and bed side  commode  PATIENT GOALS Decrease dizziness;   OBJECTIVE EXAMINATION  POSTURE: No gross deficits contributing to symptoms  NEUROLOGICAL SCREEN: (2+ unless otherwise noted.) N=normal  Ab=abnormal  Level Dermatome R L Myotome R L Reflex R L  C3 Anterior Neck N N Sidebend C2-3 N N Jaw CN V    C4 Top of Shoulder N N Shoulder Shrug C4 N N Hoffman's UMN    C5 Lateral Upper Arm N N Shoulder ABD C4-5 N N Biceps C5-6    C6 Lateral Arm/ Thumb N N Arm Flex/ Wrist Ext C5-6 N N Brachiorad. C5-6    C7 Middle Finger N N Arm Ext//Wrist Flex C6-7 N N Triceps C7    C8 4th & 5th Finger N N Flex/ Ext Carpi Ulnaris C8 N N Patellar (L3-4)    T1 Medial Arm N N Interossei T1 N N Gastrocnemius    L2 Medial thigh/groin N N Illiopsoas (L2-3) N N     L3 Lower thigh/med.knee N N Quadriceps (L3-4) N N     L4 Medial leg/lat thigh N N Tibialis Ant (L4-5) N N     L5 Lat. leg & dorsal foot N N EHL (L5) N N     S1 post/lat foot/thigh/leg N N Gastrocnemius (S1-2) N N     S2 Post./med. thigh & leg N N Hamstrings (L4-S3) N N      CRANIAL NERVES II, III, IV, VI: Pupils equal and reactive to light, visual acuity and visual fields are intact, extraocular muscles are intact  V: Facial sensation is intact and symmetric bilaterally  VII: Facial strength is intact and symmetric bilaterally  VIII: Hearing is normal as tested by gross conversation IX, X: Palate elevates midline, normal phonation, uvula midline XI: Shoulder shrug strength is intact  XII: Tongue protrudes midline   COORDINATION Finger to Nose: Normal Heel to Shin: Normal, difficult LLE due to weakness s/p L hip THR and revision; Pronator Drift: Mild positive RUE Rapid Alternating Movements: Normal Finger to Thumb Opposition: Normal   RANGE OF MOTION Cervical Spine AROM WFL and painless in all planes. No functional focal deficits in AROM noted in BUE/BLE  MANUAL MUSCLE TESTING BUE/BLE strength WNL without focal deficits  TRANSFERS/GAIT Independent for  transfers and ambulation without assistive device   PATIENT SURVEYS FOTO: 43, predicted improvement to 51  ABC: 38.1% DHI: 56/100  OCULOMOTOR / VESTIBULAR TESTING  Oculomotor Exam- Room Light  Findings Comments  Ocular Alignment normal   Ocular ROM normal   Spontaneous Nystagmus normal   Gaze-Holding Nystagmus normal   End-Gaze Nystagmus normal   Vergence (normal 2-3") not examined   Smooth Pursuit normal   Cross-Cover Test normal   Saccades normal   VOR Cancellation normal   Left Head Impulse normal   Right Head Impulse normal   Static Acuity not examined   Dynamic Acuity not examined    Oculomotor Exam- Fixation Suppressed: Deferred  BPPV TESTS:  Symptoms Duration Intensity Nystagmus  L Dix-Hallpike None  None  R Dix-Hallpike None   None  L Head Roll None   None  R Head Roll None   None  L Sidelying Test      R Sidelying Test      (blank = not tested)  Clinical Test of Sensory Interaction for Balance (CTSIB): Deferred  FUNCTIONAL OUTCOME MEASURES  Results Comments  BERG    DGI    FGA    TUG    5TSTS    6 Minute Walk Test    10 Meter Gait Speed    (blank = not tested)  Orthostatic vitals: Supine: BP: 133/64 mmHg, HR: 65 bpm, SpO2: 98% Seated: BP: 141/62 mmHg, HR: 74 bpm, SpO2: 98% Standing (1 minute): BP: 127/106mmHg, HR: 72 bpm, SpO2: 99% Standing (3 minutes): BP: 134/61 mmHg, HR: 76 bpm, SpO2: 98%    TODAY'S TREATMENT  Deferred   PATIENT EDUCATION:  Education details: Plan of care Person educated: Patient Education method: Explanation Education comprehension: verbalized understanding   HOME EXERCISE PROGRAM:  None currently   ASSESSMENT: CLINICAL IMPRESSION: Patient is a 72 y.o. female who was seen today for physical therapy evaluation and treatment for dizziness/lightheadedness.   OBJECTIVE IMPAIRMENTS: decreased balance and dizziness.   ACTIVITY LIMITATIONS: standing, transfers, and locomotion level  PARTICIPATION LIMITATIONS: meal  prep, laundry, shopping, community activity, and yard work  PERSONAL FACTORS: Age, Past/current experiences, Time since onset of injury/illness/exacerbation, and 3+ comorbidities: s/p craniotomy with shunt, anxiety, depression, bradycardia, CAD, and CKD  are also affecting patient's functional outcome.   REHAB POTENTIAL: Fair    CLINICAL DECISION MAKING: Unstable/unpredictable  EVALUATION COMPLEXITY: High   GOALS:  SHORT TERM GOALS: Target date: 04/27/2023  Pt will be independent with HEP for dizziness in order to decrease symptoms, improve balance,decrease fall risk, and improve function at home. Baseline: Goal status: INITIAL   LONG TERM GOALS: Target date: 05/25/2023  Pt will increase FOTO to at least 51 to demonstrate significant improvement in function at home related to dizziness.  Baseline: 03/30/23: 43 Goal status: INITIAL  2.  Pt will decrease DHI score by at least 18 points in order to demonstrate clinically significant reduction in disability related to dizziness.  Baseline: 03/30/23: 56/100 Goal status: INITIAL  3.  Pt will improve ABC by at least 13% in order to demonstrate clinically significant improvement in balance confidence.      Baseline: 03/30/23: 38.1% Goal status: INITIAL   PLAN: PT FREQUENCY: 1x/week  PT DURATION: 8 weeks  PLANNED INTERVENTIONS: Therapeutic exercises, Therapeutic activity, Neuromuscular re-education, Balance training, Gait training, Patient/Family education, Self Care, Joint mobilization, Joint manipulation, Vestibular training, Canalith repositioning, Orthotic/Fit training, DME instructions, Dry Needling, Electrical stimulation, Spinal manipulation, Spinal mobilization, Cryotherapy, Moist heat, Taping, Traction, Ultrasound, Ionotophoresis 4mg /ml Dexamethasone, Manual therapy, and Re-evaluation.  PLAN FOR NEXT SESSION: fixation suppression oculomotor/vestibular testing, repeat BPPV testing, mCTSIB, BERG/FGA, initiate adaptation and  habituation exercises, issue HEP;    Sharalyn Ink [place] PT, DPT, GCS  Kaleeya Hancock, PT 03/30/2023, 2:16 PM

## 2023-04-05 ENCOUNTER — Ambulatory Visit: Payer: Medicare Other

## 2023-04-05 DIAGNOSIS — R42 Dizziness and giddiness: Secondary | ICD-10-CM

## 2023-04-05 NOTE — Therapy (Signed)
OUTPATIENT PHYSICAL THERAPY VESTIBULAR TREATMENT  Patient Name: MAEBEL TEGETHOFF MRN: [id number] DOB:[birth year], 72 y.o., female Today's Date: 04/05/2023  END OF SESSION:  PT End of Session - 04/05/23 1021     Visit Number 2    Number of Visits 9    Date for PT Re-Evaluation 05/25/23    Authorization Type eval: 03/30/23    PT Start Time 1022    PT Stop Time 1100    PT Time Calculation (min) 38 min    Activity Tolerance Patient tolerated treatment well    Behavior During Therapy WFL for tasks assessed/performed            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   Depression    High cholesterol    History of kidney stones    h/o   Hypertension    Low magnesium level    PAC (premature atrial contraction)    PONV (postoperative nausea and vomiting)    nausea only   PVC's (premature ventricular contractions)    Type 2 diabetes mellitus (HCC)    Past Surgical History:  Procedure Laterality Date   ABDOMINAL HYSTERECTOMY     partial   ACETABULAR REVISION Left 06/16/2022   Procedure: LEFT HIP ACETABULAR REVISION;  Surgeon: Reinaldo Berber, MD;  Location: [facility];  Service: Orthopedics;  Laterality: Left;   BRAIN SURGERY  2010   due to cyst-done at [facility]-Pt has shunt   BREAST BIOPSY Left 01/22/2013   BENIGN BREAST TISSUE WITH FIBROCYSTIC CHANGES INCLUDING USUAL   BREAST SURGERY Right 1998   right breast biopsy   CHOLECYSTECTOMY     COLONOSCOPY  2006   [facility], Glennie Hawk, MD   COLONOSCOPY WITH PROPOFOL N/A 04/11/2018   Procedure: COLONOSCOPY WITH PROPOFOL;  Surgeon: Earline Mayotte, MD;  Location: [facility];  Service: Endoscopy;  Laterality: N/A;   ELBOW SURGERY Right    LASIK     TONSILLECTOMY     ADENOIDS X 2   TOTAL HIP ARTHROPLASTY Left 06/24/2021   Procedure: TOTAL HIP ARTHROPLASTY ANTERIOR APPROACH;   Surgeon: Kennedy Bucker, MD;  Location: [facility];  Service: Orthopedics;  Laterality: Left;   TUBAL LIGATION  1976   Patient Active Problem List   Diagnosis Date Noted   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   Uncontrolled type 2 diabetes mellitus with hyperglycemia, without long-term current use of insulin (HCC)    Stage 3a chronic kidney disease (HCC)    Coronary artery disease involving native coronary artery of native heart without angina pectoris 05/20/2022   Status post total hip replacement, left 06/24/2021   H/O craniotomy 03/07/2021   Breast lump or mass 01/23/2013   Depression 10/09/2008   SLEEP DISORDER 08/25/2008   INJURY, FINGER 04/28/2008   IMPAIRED FASTING GLUCOSE 01/07/2008   ARACHNOID CYST 11/27/2007   Dizziness and giddiness 08/20/2007   NEPHROLITHIASIS 07/09/2007   GOITER, 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   HYPERLIPIDEMIA 02/01/2007   COMMON MIGRAINE 02/01/2007   Hypertension 02/01/2007   EDEMA 02/01/2007   Hyperlipidemia 02/01/2007   PCP: [facility]  REFERRING PROVIDER: Morene Crocker, MD   REFERRING DIAG: R42 (ICD-10-CM) - Dizziness and giddiness   RATIONALE FOR EVALUATION AND TREATMENT: Rehabilitation  THERAPY DIAG: Dizziness and giddiness  ONSET DATE: June 13, 2022  FOLLOW-UP APPT SCHEDULED WITH REFERRING PROVIDER: No   FROM INITIAL EVALUATION SUBJECTIVE:   Chief Complaint:  Dizziness/lightheadedness  Pertinent History Patient with PMH significant for HTN, CAD, DMII, HLD, BPPV, macular degeneration (central vision loss), and craniotomy with shunt placement (2009) who was referred to physical therapy by neurology due to dizziness. Initial dizziness/vertigo symptoms began on June 13, 2022, three days before her left THA revision surgery. She went to urgent care and was diagnosed with "vertigo," was prescribed meclizine, and given exercises to do at home. Her symptoms eventually resolved however they returned in the  spring and continued for three straight days. She saw [facility] and was diagnosed with BPPV. She returned for 3-4 treatments and achieved full resolution. Pt had an audiogram at that time and was told she has some L sided hearing loss. Per patient, ENT was planning to perform a VNG study as well but due to improvement in symptoms it was never performed. She was also referred to neurology at that time but didn't schedule an appointment due to improvement. Symptoms returned again over the summer and so pt scheduled an appointment to see neurology. The neurologist ordered right and left leg EMG (pt waiting to schedule), routine EEG (waiting to schedule), and MRI brain without contrast. She had the MRI on 03/23/23 however the results are still not reported in the system. She saw a cardiologist and states that she had a Holter monitor over the summer without any significant findings except some PACs/PVCs. Pt also attributes symptom improvement to discontinuing hydrochlorothiazide and beta-blockers however eventually the symptoms returned (still off these medications). Currently she describes her symptoms as feeling "lightheaded like I'm going to pass out." She reports 2 brief episodes of LOC, the most recent episode occurring in early 2024 when she passed out in the shower. She denies any bowel/bladder loss or postictal state with LOC. She also denies any headaches, nausea, or vomiting.  Symptoms can occur with position changes but also when pt is sitting at rest watching television. Pt believes that she had a similar episode in 2022 as well but has difficulty remembering.   CT Angio Head/Neck 07/12/22:  IMPRESSION: 1. Negative for intracranial large vessel occlusion. 2. No significant carotid or vertebral artery stenosis in the neck. 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  Description of dizziness: lightheadedness, vision fading (2 episodes of  LOC); Frequency: last symptoms occurred approximately 1 month ago, no symptoms since that time;  Duration: days Symptom nature: constant, not related to position changes Progression of symptoms since onset: worse History of similar episodes: Yes  Provocative Factors: spontaneous Easing Factors: meclizine, waiting for symptoms to pass  Auditory complaints (tinnitus, pain, drainage, hearing loss, aural fullness): Yes, aural fullness bilaterally, R tinnitus (chronic x years); Vision changes (diplopia, visual field loss, recent changes, recent eye exam): Yes, central vision loss (history of macular degeneration); Chest pain/palpitations: No History of head injury/concussion: No Stress/anxiety: No Migraines/headaches: Yes, years ago but no issues since shunt placement; Nausea/vomiting: No Numbness/tingling: Yes, LUE tingling x 1-2 years; Focal weakness: No Dysarthria/dysphagia/drop attacks: Yes, one episode of slurred speech during the episode;  Has patient fallen in last 6 months? No Pertinent pain: Yes, chronic L hip pain; Dominant hand: right Imaging: Yes, recent MRI but results still not available in system, see history; Prior level of function: Independent Occupational demands: Not working since brain surgery in 2009 (on disability); Hobbies: watching TV, going to the beach, working in the  yard, spending time with family (great grandchildren)  Red Flags: Positive: night sweats recently, Negative for bowel/bladder changes, saddle paresthesia, personal history of cancer, unexplained weight loss/gain  PRECAUTIONS: None  WEIGHT BEARING RESTRICTIONS No  LIVING ENVIRONMENT: Lives with: lives with their partner, boyfriend Lives in: House/apartment, 2 level house with finished basement. Washer/dryer in basement, bedroom on main level; Stairs: full flight of stairs (12), L handrail when descending; Has following equipment at home: Single point cane, Walker - 2 wheeled, shower chair, and  bed side commode  PATIENT GOALS Decrease dizziness;   OBJECTIVE EXAMINATION  POSTURE: No gross deficits contributing to symptoms  NEUROLOGICAL SCREEN: (2+ unless otherwise noted.) N=normal  Ab=abnormal  Level Dermatome R L Myotome R L Reflex R L  C3 Anterior Neck N N Sidebend C2-3 N N Jaw CN V    C4 Top of Shoulder N N Shoulder Shrug C4 N N Hoffman's UMN    C5 Lateral Upper Arm N N Shoulder ABD C4-5 N N Biceps C5-6    C6 Lateral Arm/ Thumb N N Arm Flex/ Wrist Ext C5-6 N N Brachiorad. C5-6    C7 Middle Finger N N Arm Ext//Wrist Flex C6-7 N N Triceps C7    C8 4th & 5th Finger N N Flex/ Ext Carpi Ulnaris C8 N N Patellar (L3-4)    T1 Medial Arm N N Interossei T1 N N Gastrocnemius    L2 Medial thigh/groin N N Illiopsoas (L2-3) N N     L3 Lower thigh/med.knee N N Quadriceps (L3-4) N N     L4 Medial leg/lat thigh N N Tibialis Ant (L4-5) N N     L5 Lat. leg & dorsal foot N N EHL (L5) N N     S1 post/lat foot/thigh/leg N N Gastrocnemius (S1-2) N N     S2 Post./med. thigh & leg N N Hamstrings (L4-S3) N N      CRANIAL NERVES II, III, IV, VI: Pupils equal and reactive to light, visual acuity and visual fields are intact, extraocular muscles are intact  V: Facial sensation is intact and symmetric bilaterally  VII: Facial strength is intact and symmetric bilaterally  VIII: Hearing is normal as tested by gross conversation IX, X: Palate elevates midline, normal phonation, uvula midline XI: Shoulder shrug strength is intact  XII: Tongue protrudes midline   COORDINATION Finger to Nose: Normal Heel to Shin: Normal, difficult LLE due to weakness s/p L hip THR and revision; Pronator Drift: Mild positive RUE Rapid Alternating Movements: Normal Finger to Thumb Opposition: Normal   RANGE OF MOTION Cervical Spine AROM WFL and painless in all planes. No functional focal deficits in AROM noted in BUE/BLE  MANUAL MUSCLE TESTING BUE/BLE strength WNL without focal  deficits  TRANSFERS/GAIT Independent for transfers and ambulation without assistive device   PATIENT SURVEYS FOTO: 43, predicted improvement to 51  ABC: 38.1% DHI: 56/100  OCULOMOTOR / VESTIBULAR TESTING  Oculomotor Exam- Room Light  Findings Comments  Ocular Alignment normal   Ocular ROM normal   Spontaneous Nystagmus normal   Gaze-Holding Nystagmus normal   End-Gaze Nystagmus normal   Vergence (normal 2-3") not examined   Smooth Pursuit normal   Cross-Cover Test normal   Saccades normal   VOR Cancellation normal   Left Head Impulse normal   Right Head Impulse normal   Static Acuity not examined   Dynamic Acuity not examined    Oculomotor Exam- Fixation Suppressed: Deferred  BPPV TESTS:  Symptoms Duration Intensity Nystagmus  L Dix-Hallpike  None   None  R Dix-Hallpike None   None  L Head Roll None   None  R Head Roll None   None  L Sidelying Test      R Sidelying Test      (blank = not tested)  Clinical Test of Sensory Interaction for Balance (CTSIB): Deferred  FUNCTIONAL OUTCOME MEASURES  Results Comments  BERG    DGI    FGA    TUG    5TSTS    6 Minute Walk Test    10 Meter Gait Speed    (blank = not tested)  Orthostatic vitals: Supine: BP: 133/64 mmHg, HR: 65 bpm, SpO2: 98% Seated: BP: 141/62 mmHg, HR: 74 bpm, SpO2: 98% Standing (1 minute): BP: 127/100mmHg, HR: 72 bpm, SpO2: 99% Standing (3 minutes): BP: 134/61 mmHg, HR: 76 bpm, SpO2: 98%    TODAY'S TREATMENT   SUBJECTIVE: Pt reports that she is doing well today. No episodes of dizziness or vertigo since the initial evaluation. No changes in health reported. No specific questions or concerns currently.  PAIN: Denies  Neuromuscular Re-education  FGA: 23/30; Dix-Hallpike and Roll Tests are negative bilaterally for both vertigo and nystagmus;  Oculomotor Exam- Fixation Suppressed  Findings Comments  Ocular Alignment normal   Spontaneous Nystagmus normal   Gaze-Holding Nystagmus normal    End-Gaze Nystagmus normal   Head Shaking Nystagmus abnormal Faint L horizontal beating nystagmus  Pressure-Induced Nystagmus normal   Hyperventilation Induced Nystagmus not examined   Skull Vibration Induced Nystagmus abnormal Pure horizontal L beating nystagmus   mCTSIB: conditions 1-3: 30s, condition 4: 4.7s; VOR x 1 horizontal in sitting, target on plain wall at arms length x 60s; VOR x 1 horizontal in standing with feet apart, target on plain wall at arms length x 60s; VOR x 1 horizontal in standing with feet together, target on plain wall at arms length x 60s;   PATIENT EDUCATION:  Education details: Plan of care, UVH, HEP Person educated: Patient Education method: Explanation, demonstration, and handout Education comprehension: verbalized understanding and returned demonstration;   HOME EXERCISE PROGRAM:  Access Code: [email or web address] URL: [email or web address] Date: 04/05/2023 Prepared by: Ria Comment  Exercises - Standing Gaze Stabilization with Head Rotation  - 4 x daily - 7 x weekly - 3 reps - 60 seconds hold  Vestibular Hypofunction Handout   ASSESSMENT: CLINICAL IMPRESSION: Performed additional testing with patient during visit today. She scored 23/30 on the FGA indicating notable functional balance impairments. She loses her balance in less than 5 seconds during condition 4 of the modified CTSIB. Also performed fixation suppression vestibular/oculomotor testing. Pt demonstrates a post-headshake and skull vibration induced pure L horizontal beating nystagmus. Findings suggestive of unilateral vestibular hypofunction. Education pt on [facility] and initiated gaze stabilization exercises. Pt encouraged to follow-up as scheduled. She will benefit from PT services to address deficits in balance and dizziness in order to return to full function at home.   OBJECTIVE IMPAIRMENTS: decreased balance and dizziness.   ACTIVITY LIMITATIONS: standing, transfers, and locomotion  level  PARTICIPATION LIMITATIONS: meal prep, laundry, shopping, community activity, and yard work  PERSONAL FACTORS: Age, Past/current experiences, Time since onset of injury/illness/exacerbation, and 3+ comorbidities: s/p craniotomy with shunt, anxiety, depression, bradycardia, CAD, and CKD  are also affecting patient's functional outcome.   REHAB POTENTIAL: Fair    CLINICAL DECISION MAKING: Unstable/unpredictable  EVALUATION COMPLEXITY: High   GOALS:  SHORT TERM GOALS: Target date: 04/27/2023  Pt will be independent with HEP for dizziness in order  to decrease symptoms, improve balance,decrease fall risk, and improve function at home. Baseline: Goal status: INITIAL   LONG TERM GOALS: Target date: 05/25/2023  Pt will increase FOTO to at least 51 to demonstrate significant improvement in function at home related to dizziness.  Baseline: 03/30/23: 43 Goal status: INITIAL  2.  Pt will decrease DHI score by at least 18 points in order to demonstrate clinically significant reduction in disability related to dizziness.  Baseline: 03/30/23: 56/100 Goal status: INITIAL  3.  Pt will improve ABC by at least 13% in order to demonstrate clinically significant improvement in balance confidence.      Baseline: 03/30/23: 38.1% Goal status: INITIAL  4.  Pt will improve FGA by at least 4 points in order to demonstrate clinically significant improvement in balance and decreased risk for falls       Baseline: 04/05/23: 23/30; Goal status: INITIAL   PLAN: PT FREQUENCY: 1x/week  PT DURATION: 8 weeks  PLANNED INTERVENTIONS: Therapeutic exercises, Therapeutic activity, Neuromuscular re-education, Balance training, Gait training, Patient/Family education, Self Care, Joint mobilization, Joint manipulation, Vestibular training, Canalith repositioning, Orthotic/Fit training, DME instructions, Dry Needling, Electrical stimulation, Spinal manipulation, Spinal mobilization, Cryotherapy, Moist heat, Taping,  Traction, Ultrasound, Ionotophoresis 4mg /ml Dexamethasone, Manual therapy, and Re-evaluation.  PLAN FOR NEXT SESSION: BERG, progress adaptation and habituation exercises, review/modify HEP as needed;    Sharalyn Ink Lilyrose Tanney PT, DPT, GCS  [place], PT 04/05/2023, 11:37 AM

## 2023-04-11 NOTE — Therapy (Signed)
OUTPATIENT PHYSICAL THERAPY VESTIBULAR TREATMENT/DISCHARGE  Patient Name: Kayla Wade MRN: [id number] DOB:1951-01-13, 72 y.o., female Today's Date: 04/12/2023  END OF SESSION:  PT End of Session - 04/12/23 1004     Visit Number 3    Number of Visits 9    Date for PT Re-Evaluation 05/25/23    Authorization Type eval: 03/30/23    PT Start Time 1010    PT Stop Time 1050    PT Time Calculation (min) 40 min    Activity Tolerance Patient tolerated treatment well    Behavior During Therapy WFL for tasks assessed/performed            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   Depression    High cholesterol    History of kidney stones    h/o   Hypertension    Low magnesium level    PAC (premature atrial contraction)    PONV (postoperative nausea and vomiting)    nausea only   PVC's (premature ventricular contractions)    Type 2 diabetes mellitus (HCC)    Past Surgical History:  Procedure Laterality Date   ABDOMINAL HYSTERECTOMY     partial   ACETABULAR REVISION Left 06/16/2022   Procedure: LEFT HIP ACETABULAR REVISION;  Surgeon: Reinaldo Berber, MD;  Location: [facility] ORS;  Service: Orthopedics;  Laterality: Left;   BRAIN SURGERY  2010   due to cyst-done at [facility]-Pt has shunt   BREAST BIOPSY Left 01/22/2013   BENIGN BREAST TISSUE WITH FIBROCYSTIC CHANGES INCLUDING USUAL   BREAST SURGERY Right 1998   right breast biopsy   CHOLECYSTECTOMY     COLONOSCOPY  2006   [facility], Glennie Hawk, MD   COLONOSCOPY WITH PROPOFOL N/A 04/11/2018   Procedure: COLONOSCOPY WITH PROPOFOL;  Surgeon: Earline Mayotte, MD;  Location: [facility] ENDOSCOPY;  Service: Endoscopy;  Laterality: N/A;   ELBOW SURGERY Right    LASIK     TONSILLECTOMY     ADENOIDS X 2   TOTAL HIP ARTHROPLASTY Left 06/24/2021   Procedure: TOTAL HIP ARTHROPLASTY ANTERIOR  APPROACH;  Surgeon: Kennedy Bucker, MD;  Location: [facility] OR[facility];  Service: Orthopedics;  Laterality: Left;   TUBAL LIGATION  1976   Patient Active Problem List   Diagnosis Date Noted   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   Uncontrolled type 2 diabetes mellitus with hyperglycemia, without long-term current use of insulin (HCC)    Stage 3a chronic kidney disease (HCC)    Coronary artery disease involving native coronary artery of native heart without angina pectoris 05/20/2022   Status post total hip replacement, left 06/24/2021   H/O craniotomy 03/07/2021   Breast lump or mass 01/23/2013   Depression 10/09/2008   SLEEP DISORDER 08/25/2008   INJURY, FINGER 04/28/2008   IMPAIRED FASTING GLUCOSE 01/07/2008   ARACHNOID CYST 11/27/2007   Dizziness and giddiness 08/20/2007   NEPHROLITHIASIS 07/09/2007   GOITER, 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   HYPERLIPIDEMIA 02/01/2007   COMMON MIGRAINE 02/01/2007   Hypertension 02/01/2007   EDEMA 02/01/2007   Hyperlipidemia 02/01/2007   PCP: [facility]  REFERRING PROVIDER: Morene Crocker, MD   REFERRING DIAG: R42 (ICD-10-CM) - Dizziness and giddiness   RATIONALE FOR EVALUATION AND TREATMENT: Rehabilitation  THERAPY DIAG: Dizziness and giddiness  ONSET DATE: June 13, 2022  FOLLOW-UP APPT SCHEDULED WITH REFERRING PROVIDER: No   FROM INITIAL EVALUATION SUBJECTIVE:   Chief Complaint:  Dizziness/lightheadedness  Pertinent History Patient with PMH significant for HTN, CAD, DMII, HLD, BPPV, macular degeneration (central vision loss), and craniotomy with shunt placement (2009) who was referred to physical therapy by neurology due to dizziness. Initial dizziness/vertigo symptoms began on June 13, 2022, three days before her left THA revision surgery. She went to urgent care and was diagnosed with "vertigo," was prescribed meclizine, and given exercises to do at home. Her symptoms eventually resolved however they  returned in the spring and continued for three straight days. She saw [facility] and was diagnosed with BPPV. She returned for 3-4 treatments and achieved full resolution. Pt had an audiogram at that time and was told she has some L sided hearing loss. Per patient, ENT was planning to perform a VNG study as well but due to improvement in symptoms it was never performed. She was also referred to neurology at that time but didn't schedule an appointment due to improvement. Symptoms returned again over the summer and so pt scheduled an appointment to see neurology. The neurologist ordered right and left leg EMG (pt waiting to schedule), routine EEG (waiting to schedule), and MRI brain without contrast. She had the MRI on 03/23/23 however the results are still not reported in the system. She saw a cardiologist and states that she had a Holter monitor over the summer without any significant findings except some PACs/PVCs. Pt also attributes symptom improvement to discontinuing hydrochlorothiazide and beta-blockers however eventually the symptoms returned (still off these medications). Currently she describes her symptoms as feeling "lightheaded like I'm going to pass out." She reports 2 brief episodes of LOC, the most recent episode occurring in early 2024 when she passed out in the shower. She denies any bowel/bladder loss or postictal state with LOC. She also denies any headaches, nausea, or vomiting.  Symptoms can occur with position changes but also when pt is sitting at rest watching television. Pt believes that she had a similar episode in 2022 as well but has difficulty remembering.   CT Angio Head/Neck 07/12/22:  IMPRESSION: 1. Negative for intracranial large vessel occlusion. 2. No significant carotid or vertebral artery stenosis in the neck. 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  Description of dizziness: lightheadedness, vision fading (2 episodes of  LOC); Frequency: last symptoms occurred approximately 1 month ago, no symptoms since that time;  Duration: days Symptom nature: constant, not related to position changes Progression of symptoms since onset: worse History of similar episodes: Yes  Provocative Factors: spontaneous Easing Factors: meclizine, waiting for symptoms to pass  Auditory complaints (tinnitus, pain, drainage, hearing loss, aural fullness): Yes, aural fullness bilaterally, R tinnitus (chronic x years); Vision changes (diplopia, visual field loss, recent changes, recent eye exam): Yes, central vision loss (history of macular degeneration); Chest pain/palpitations: No History of head injury/concussion: No Stress/anxiety: No Migraines/headaches: Yes, years ago but no issues since shunt placement; Nausea/vomiting: No Numbness/tingling: Yes, LUE tingling x 1-2 years; Focal weakness: No Dysarthria/dysphagia/drop attacks: Yes, one episode of slurred speech during the episode;  Has patient fallen in last 6 months? No Pertinent pain: Yes, chronic L hip pain; Dominant hand: right Imaging: Yes, recent MRI but results still not available in system, see history; Prior level of function: Independent Occupational demands: Not working since brain surgery in 2009 (on disability); Hobbies: watching TV, going to the beach, working in the  yard, spending time with family (great grandchildren)  Red Flags: Positive: night sweats recently, Negative for bowel/bladder changes, saddle paresthesia, personal history of cancer, unexplained weight loss/gain  PRECAUTIONS: None  WEIGHT BEARING RESTRICTIONS No  LIVING ENVIRONMENT: Lives with: lives with their partner, boyfriend Lives in: House/apartment, 2 level house with finished basement. Washer/dryer in basement, bedroom on main level; Stairs: full flight of stairs (12), L handrail when descending; Has following equipment at home: Single point cane, Walker - 2 wheeled, shower chair, and  bed side commode  PATIENT GOALS Decrease dizziness;   OBJECTIVE EXAMINATION  POSTURE: No gross deficits contributing to symptoms  NEUROLOGICAL SCREEN: (2+ unless otherwise noted.) N=normal  Ab=abnormal  Level Dermatome R L Myotome R L Reflex R L  C3 Anterior Neck N N Sidebend C2-3 N N Jaw CN V    C4 Top of Shoulder N N Shoulder Shrug C4 N N Hoffman's UMN    C5 Lateral Upper Arm N N Shoulder ABD C4-5 N N Biceps C5-6    C6 Lateral Arm/ Thumb N N Arm Flex/ Wrist Ext C5-6 N N Brachiorad. C5-6    C7 Middle Finger N N Arm Ext//Wrist Flex C6-7 N N Triceps C7    C8 4th & 5th Finger N N Flex/ Ext Carpi Ulnaris C8 N N Patellar (L3-4)    T1 Medial Arm N N Interossei T1 N N Gastrocnemius    L2 Medial thigh/groin N N Illiopsoas (L2-3) N N     L3 Lower thigh/med.knee N N Quadriceps (L3-4) N N     L4 Medial leg/lat thigh N N Tibialis Ant (L4-5) N N     L5 Lat. leg & dorsal foot N N EHL (L5) N N     S1 post/lat foot/thigh/leg N N Gastrocnemius (S1-2) N N     S2 Post./med. thigh & leg N N Hamstrings (L4-S3) N N      CRANIAL NERVES II, III, IV, VI: Pupils equal and reactive to light, visual acuity and visual fields are intact, extraocular muscles are intact  V: Facial sensation is intact and symmetric bilaterally  VII: Facial strength is intact and symmetric bilaterally  VIII: Hearing is normal as tested by gross conversation IX, X: Palate elevates midline, normal phonation, uvula midline XI: Shoulder shrug strength is intact  XII: Tongue protrudes midline   COORDINATION Finger to Nose: Normal Heel to Shin: Normal, difficult LLE due to weakness s/p L hip THR and revision; Pronator Drift: Mild positive RUE Rapid Alternating Movements: Normal Finger to Thumb Opposition: Normal   RANGE OF MOTION Cervical Spine AROM WFL and painless in all planes. No functional focal deficits in AROM noted in BUE/BLE  MANUAL MUSCLE TESTING BUE/BLE strength WNL without focal  deficits  TRANSFERS/GAIT Independent for transfers and ambulation without assistive device   PATIENT SURVEYS FOTO: 43, predicted improvement to 51  ABC: 38.1% DHI: 56/100  OCULOMOTOR / VESTIBULAR TESTING  Oculomotor Exam- Room Light  Findings Comments  Ocular Alignment normal   Ocular ROM normal   Spontaneous Nystagmus normal   Gaze-Holding Nystagmus normal   End-Gaze Nystagmus normal   Vergence (normal 2-3") not examined   Smooth Pursuit normal   Cross-Cover Test normal   Saccades normal   VOR Cancellation normal   Left Head Impulse normal   Right Head Impulse normal   Static Acuity not examined   Dynamic Acuity not examined    Oculomotor Exam- Fixation Suppressed 04/05/23)  Findings Comments  Ocular Alignment normal   Spontaneous Nystagmus  normal   Gaze-Holding Nystagmus normal   End-Gaze Nystagmus normal   Head Shaking Nystagmus abnormal Faint L horizontal beating nystagmus  Pressure-Induced Nystagmus normal   Hyperventilation Induced Nystagmus not examined   Skull Vibration Induced Nystagmus abnormal Pure horizontal L beating nystagmus   BPPV TESTS:  Symptoms Duration Intensity Nystagmus  L Dix-Hallpike None   None  R Dix-Hallpike None   None  L Head Roll None   None  R Head Roll None   None  L Sidelying Test      R Sidelying Test      (blank = not tested)  Clinical Test of Sensory Interaction for Balance (CTSIB): Deferred  FUNCTIONAL OUTCOME MEASURES  04/05/23 Comments  BERG    DGI    FGA 23/30   TUG    5TSTS    6 Minute Walk Test    10 Meter Gait Speed    (blank = not tested)  Orthostatic vitals: Supine: BP: 133/64 mmHg, HR: 65 bpm, SpO2: 98% Seated: BP: 141/62 mmHg, HR: 74 bpm, SpO2: 98% Standing (1 minute): BP: 127/43mmHg, HR: 72 bpm, SpO2: 99% Standing (3 minutes): BP: 134/61 mmHg, HR: 76 bpm, SpO2: 98%   mCTSIB: conditions 1-3: 30s, condition 4: 4.7s;   TODAY'S TREATMENT   SUBJECTIVE: Pt reports that she is doing well today. No  episodes of dizziness or vertigo since the last therapy session. No changes in health reported. No specific questions or concerns currently. She believes that she is ready for discharge.   PAIN: Denies   Neuromuscular Re-education  Dix-Hallpike is negative bilaterally for both vertigo and nystagmus; BERG: 53/56; Pt completed ABC, DHI, and FOTO; VOR x 1 horizontal in standing with feet together, target on plain wall at arms length x 60s (no dizziness); VOR x 1 horizontal in standing on Airex pad with feet together, target on plain wall at arms length 2 x 60s (no dizziness); Forward walking with VOR x 1 horizontal 2 x 30' (no dizziness); Forward walking with VOR x 1 vertical x 30' (no dizziness); Airex feet together eyes open/closed x 30s each; Airex feet together with eyes open and horizontal and vertical head turns x 30s each; Airex feet together with eyes closed and horizontal and vertical head turns x 30s each; Updated and reviewed HEP with patient, discharge instructions provided;   PATIENT EDUCATION:  Education details: Discharge Person educated: Patient Education method: Explanation, demonstration, and handout Education comprehension: verbalized understanding and returned demonstration;   HOME EXERCISE PROGRAM:  Access Code: [email or web address] URL: [email or web address] Date: 04/12/2023 Prepared by: Ria Comment  Exercises - Walking Gaze Stabilization Head Rotation  - 1-2 x daily - 7 x weekly - 3 reps - 60s hold - Romberg Stance Eyes Closed on Foam Pad  - 1-2 x daily - 7 x weekly - 3 reps - 60s hold - Narrow Stance with Eyes Closed and Head Rotation on Foam Pad  - 1-2 x daily - 7 x weekly - 3 reps - 60s hold - Narrow Stance with Eyes Closed and Head Nods on Foam Pad  - 1-2 x daily - 7 x weekly - 3 reps - 60s hold - Self-Epley Maneuver Right Ear  - 1 x daily - 7 x weekly - 60s in each position hold - Self-Epley Maneuver Left Ear  - 1 x daily - 7 x weekly - 60s in each  position hold  Patient Education - BPPV  Vestibular Hypofunction Handout   ASSESSMENT: CLINICAL IMPRESSION: Performed additional testing with patient during  visit today. She scored 53/56 on the BERG indicating only minimal balance impairments.  Updated DHI which has now improved to 0/100. Her FOTO also improved from 43 to 76 which is above her target goal at the start of therapy. Her ABC improved from 38.1% to 83.1%. Progressed adaptation and habituation exercises with patient. She denies any dizziness throughout session. Updated HEP and reviewed with patient. At this point pt has met all of her goals and is ready for discharge.   OBJECTIVE IMPAIRMENTS: decreased balance and dizziness.   ACTIVITY LIMITATIONS: standing, transfers, and locomotion level  PARTICIPATION LIMITATIONS: meal prep, laundry, shopping, community activity, and yard work  PERSONAL FACTORS: Age, Past/current experiences, Time since onset of injury/illness/exacerbation, and 3+ comorbidities: s/p craniotomy with shunt, anxiety, depression, bradycardia, CAD, and CKD  are also affecting patient's functional outcome.   REHAB POTENTIAL: Fair    CLINICAL DECISION MAKING: Unstable/unpredictable  EVALUATION COMPLEXITY: High   GOALS:  SHORT TERM GOALS: Target date: 04/27/2023  Pt will be independent with HEP for dizziness in order to decrease symptoms, improve balance,decrease fall risk, and improve function at home. Baseline: Goal status: INITIAL   LONG TERM GOALS: Target date: 05/25/2023  Pt will increase FOTO to at least 51 to demonstrate significant improvement in function at home related to dizziness.  Baseline: 03/30/23: 43; 04/12/23: 67; Goal status: MET  2.  Pt will decrease DHI score by at least 18 points in order to demonstrate clinically significant reduction in disability related to dizziness.  Baseline: 03/30/23: 56/100; 04/12/23: 0/100; Goal status: MET  3.  Pt will improve ABC by at least 13% in order to  demonstrate clinically significant improvement in balance confidence.      Baseline: 03/30/23: 38.1%; 04/12/23: 83.1% Goal status: MET  4.  Pt will improve FGA by at least 4 points in order to demonstrate clinically significant improvement in balance and decreased risk for falls       Baseline: 04/05/23: 23/30; Goal status: DISCONTINUED;   PLAN: PT FREQUENCY: 1x/week  PT DURATION: 8 weeks  PLANNED INTERVENTIONS: Therapeutic exercises, Therapeutic activity, Neuromuscular re-education, Balance training, Gait training, Patient/Family education, Self Care, Joint mobilization, Joint manipulation, Vestibular training, Canalith repositioning, Orthotic/Fit training, DME instructions, Dry Needling, Electrical stimulation, Spinal manipulation, Spinal mobilization, Cryotherapy, Moist heat, Taping, Traction, Ultrasound, Ionotophoresis 4mg /ml Dexamethasone, Manual therapy, and Re-evaluation.  PLAN FOR NEXT SESSION: Discharge   Sharalyn Ink  PT, DPT, GCS  ,, PT 04/12/2023, 11:08 AM

## 2023-04-12 ENCOUNTER — Ambulatory Visit: Payer: Medicare Other | Attending: Neurology

## 2023-04-12 DIAGNOSIS — R42 Dizziness and giddiness: Secondary | ICD-10-CM | POA: Insufficient documentation

## 2023-05-25 ENCOUNTER — Other Ambulatory Visit: Payer: Self-pay | Admitting: Internal Medicine

## 2023-05-25 DIAGNOSIS — Z1231 Encounter for screening mammogram for malignant neoplasm of breast: Secondary | ICD-10-CM

## 2023-07-18 ENCOUNTER — Encounter: Payer: Self-pay | Admitting: *Deleted

## 2023-08-08 ENCOUNTER — Encounter: Payer: Self-pay | Admitting: *Deleted

## 2023-08-08 ENCOUNTER — Ambulatory Visit
Admission: RE | Admit: 2023-08-08 | Discharge: 2023-08-08 | Disposition: A | Discharge status: Home / Self Care | Payer: Medicare HMO | Attending: Gastroenterology | Admitting: Gastroenterology

## 2023-08-08 ENCOUNTER — Encounter
Admission: RE | Disposition: A | Discharge status: Home / Self Care | Payer: Self-pay | Source: Home / Self Care | Attending: Gastroenterology

## 2023-08-08 ENCOUNTER — Ambulatory Visit: Payer: Medicare HMO | Admitting: Anesthesiology

## 2023-08-08 DIAGNOSIS — I10 Essential (primary) hypertension: Secondary | ICD-10-CM | POA: Insufficient documentation

## 2023-08-08 DIAGNOSIS — Z9049 Acquired absence of other specified parts of digestive tract: Secondary | ICD-10-CM | POA: Insufficient documentation

## 2023-08-08 DIAGNOSIS — F319 Bipolar disorder, unspecified: Secondary | ICD-10-CM | POA: Diagnosis not present

## 2023-08-08 DIAGNOSIS — Z90710 Acquired absence of both cervix and uterus: Secondary | ICD-10-CM | POA: Diagnosis not present

## 2023-08-08 DIAGNOSIS — Z87891 Personal history of nicotine dependence: Secondary | ICD-10-CM | POA: Diagnosis not present

## 2023-08-08 DIAGNOSIS — J45909 Unspecified asthma, uncomplicated: Secondary | ICD-10-CM | POA: Diagnosis not present

## 2023-08-08 DIAGNOSIS — E7800 Pure hypercholesterolemia, unspecified: Secondary | ICD-10-CM | POA: Insufficient documentation

## 2023-08-08 DIAGNOSIS — N1831 Chronic kidney disease, stage 3a: Secondary | ICD-10-CM | POA: Diagnosis not present

## 2023-08-08 DIAGNOSIS — R194 Change in bowel habit: Secondary | ICD-10-CM | POA: Insufficient documentation

## 2023-08-08 DIAGNOSIS — E119 Type 2 diabetes mellitus without complications: Secondary | ICD-10-CM | POA: Diagnosis not present

## 2023-08-08 DIAGNOSIS — E1122 Type 2 diabetes mellitus with diabetic chronic kidney disease: Secondary | ICD-10-CM | POA: Diagnosis not present

## 2023-08-08 DIAGNOSIS — I129 Hypertensive chronic kidney disease with stage 1 through stage 4 chronic kidney disease, or unspecified chronic kidney disease: Secondary | ICD-10-CM | POA: Diagnosis not present

## 2023-08-08 DIAGNOSIS — R159 Full incontinence of feces: Secondary | ICD-10-CM | POA: Diagnosis not present

## 2023-08-08 DIAGNOSIS — K640 First degree hemorrhoids: Secondary | ICD-10-CM | POA: Insufficient documentation

## 2023-08-08 DIAGNOSIS — Z7984 Long term (current) use of oral hypoglycemic drugs: Secondary | ICD-10-CM | POA: Diagnosis not present

## 2023-08-08 DIAGNOSIS — K649 Unspecified hemorrhoids: Secondary | ICD-10-CM | POA: Diagnosis not present

## 2023-08-08 DIAGNOSIS — I2510 Atherosclerotic heart disease of native coronary artery without angina pectoris: Secondary | ICD-10-CM | POA: Diagnosis not present

## 2023-08-08 HISTORY — DX: Headache, unspecified: R51.9

## 2023-08-08 HISTORY — DX: Fatty (change of) liver, not elsewhere classified: K76.0

## 2023-08-08 HISTORY — PX: COLONOSCOPY WITH PROPOFOL: SHX5780

## 2023-08-08 HISTORY — DX: Bradycardia, unspecified: R00.1

## 2023-08-08 HISTORY — DX: Deficiency of other specified B group vitamins: E53.8

## 2023-08-08 HISTORY — DX: Cerebral cysts: G93.0

## 2023-08-08 HISTORY — DX: Other forms of dyspnea: R06.09

## 2023-08-08 HISTORY — DX: Palpitations: R00.2

## 2023-08-08 HISTORY — DX: Nontoxic single thyroid nodule: E04.1

## 2023-08-08 HISTORY — DX: Atherosclerotic heart disease of native coronary artery without angina pectoris: I25.10

## 2023-08-08 LAB — GLUCOSE, CAPILLARY: Glucose-Capillary: 127 mg/dL — ABNORMAL HIGH (ref 70–99)

## 2023-08-08 SURGERY — COLONOSCOPY WITH PROPOFOL
Anesthesia: General

## 2023-08-08 MED ORDER — SODIUM CHLORIDE 0.9 % IV SOLN: INTRAVENOUS | Status: DC

## 2023-08-08 MED ORDER — PROPOFOL 10 MG/ML IV BOLUS
INTRAVENOUS | Status: DC | PRN
  Administered 2023-08-08: 60 mg via INTRAVENOUS
  Administered 2023-08-08: 10 mg via INTRAVENOUS

## 2023-08-08 MED ORDER — LIDOCAINE HCL (CARDIAC) PF 100 MG/5ML IV SOSY
PREFILLED_SYRINGE | INTRAVENOUS | Status: DC | PRN
  Administered 2023-08-08: 100 mg via INTRAVENOUS

## 2023-08-08 MED ORDER — PROPOFOL 500 MG/50ML IV EMUL
INTRAVENOUS | Status: DC | PRN
  Administered 2023-08-08: 165 ug/kg/min via INTRAVENOUS

## 2023-08-08 MED ORDER — DEXMEDETOMIDINE HCL IN NACL 200 MCG/50ML IV SOLN
INTRAVENOUS | Status: DC | PRN
  Administered 2023-08-08: 8 ug via INTRAVENOUS

## 2023-08-08 NOTE — Anesthesia Postprocedure Evaluation (Signed)
Anesthesia Post Note  Patient: Kayla Wade  Procedure(s) Performed: COLONOSCOPY WITH PROPOFOL  Patient location during evaluation: PACU Anesthesia Type: General Level of consciousness: awake and alert, oriented and patient cooperative Pain management: pain level controlled Vital Signs Assessment: post-procedure vital signs reviewed and stable Respiratory status: spontaneous breathing, nonlabored ventilation and respiratory function stable Cardiovascular status: blood pressure returned to baseline and stable Postop Assessment: adequate PO intake Anesthetic complications: no   No notable events documented.   Last Vitals:  Vitals:   08/08/23 0855 08/08/23 0905  BP: (!) 103/56 123/68  Pulse: 72 67  Resp: 12 14  Temp:    SpO2: 99% 98%    Last Pain:  Vitals:   08/08/23 0905  TempSrc:   PainSc: 0-No pain                 Reed Breech

## 2023-08-08 NOTE — Anesthesia Preprocedure Evaluation (Addendum)
Anesthesia Evaluation  Patient identified by MRN, date of birth, ID band Patient awake    Reviewed: Allergy & Precautions, NPO status , Patient's Chart, lab work & pertinent test results  History of Anesthesia Complications (+) PONV and history of anesthetic complications  Airway Mallampati: IV   Neck ROM: Full    Dental  (+) Upper Dentures, Partial Lower   Pulmonary asthma    Pulmonary exam normal breath sounds clear to auscultation       Cardiovascular hypertension, + CAD (nonobstructive)  Normal cardiovascular exam Rhythm:Regular Rate:Normal  Myocardial perfusion 03/23/22:  1.  Negative ETT  2.  Normal left ventricular function  3.  Normal wall motion  4.  No evidence for scar or ischemia   Echo 02/02/22:  NORMAL LEFT VENTRICULAR SYSTOLIC FUNCTION  NORMAL RIGHT VENTRICULAR SYSTOLIC FUNCTION  NO VALVULAR STENOSIS  MILD MR, TR  TRIVIAL PR  EF 55%    Neuro/Psych  Headaches PSYCHIATRIC DISORDERS Anxiety Depression Bipolar Disorder      GI/Hepatic ,GERD  ,,  Endo/Other  diabetes, Type 2    Renal/GU Renal disease (nephrolithiasis)     Musculoskeletal  (+) Arthritis ,    Abdominal   Peds  Hematology negative hematology ROS (+)   Anesthesia Other Findings Cardiology note 06/15/23:  72 y.o. female with  1. Sinus bradycardia  2. Essential hypertension  3. Coronary artery disease involving native coronary artery of native heart without angina pectoris  4. Exertional dyspnea  5. Type 2 diabetes mellitus with hyperglycemia, without long-term current use of insulin (CMS/HHS-HCC)  6. Pure hypercholesterolemia  7. Dizziness and giddiness   73 year old female initially referred for bradycardia, secondary to propanolol, resolved after discontinuation of propanolol. 72-hour Holter monitor revealed predominant sinus rhythm with mean heart rate of 77 bpm, frequent PACs and PVCs, 2 brief atrial runs. Patient has essential  hypertension, blood pressure low today. She reported a several month history of progressively worsening exertional dyspnea, exercise intolerance, without chest pain, or lower extremity edema. 2D echocardiogram revealed normal LV function with mild MR. ETT Myoview was negative for evidence of scar or ischemia. Cardiac CTA showed mild nonobstructive CAD. She had recent sudden onset of ataxia and blurred vision, was evaluated at [facility] ER with negative work up including brain MRI, CTA head and neck, and labs. Meclizine helped. She was evaluated by ENT who prescribed Z-pack for cough and congestion. She had a syncope episode the day she started the antibiotic, with prodromal tunnel vision. At a recent visit, she reported more frequent episodes of dizziness, occurring at rest, unrelated to position changes or exertion. Holter monitor was unremarkable. Dizziness resolved completely after discontinuation of HCTZ. She has been followed by ENT for dizziness, has undergone Dix-Hallpike maneuver for dizziness with resolution. Blood pressure is well controlled.   Plan   1. Continue current medications 2. Recommend Mediterranean Diet 3. Defer AV nodal blocking agents 4. Continue to hold HCTZ 5. Advised patient to exercise regularly as tolerated 6. Return to clinic for follow up in 1 year  No orders of the defined types were placed in this encounter.  Return in about 1 year (around 06/14/2024).    Reproductive/Obstetrics                             Anesthesia Physical Anesthesia Plan  ASA: 3  Anesthesia Plan: General   Post-op Pain Management:    Induction: Intravenous  PONV Risk Score and Plan: 4 or  greater and Propofol infusion, TIVA and Treatment may vary due to age or medical condition  Airway Management Planned: Natural Airway  Additional Equipment:   Intra-op Plan:   Post-operative Plan:   Informed Consent: I have reviewed the patients History and Physical, chart,  labs and discussed the procedure including the risks, benefits and alternatives for the proposed anesthesia with the patient or authorized representative who has indicated his/her understanding and acceptance.       Plan Discussed with: CRNA  Anesthesia Plan Comments: (LMA/GETA backup discussed.  Patient consented for risks of anesthesia including but not limited to:  - adverse reactions to medications - damage to eyes, teeth, lips or other oral mucosa - nerve damage due to positioning  - sore throat or hoarseness - damage to heart, brain, nerves, lungs, other parts of body or loss of life  Informed patient about role of CRNA in peri- and intra-operative care.  Patient voiced understanding.)        Anesthesia Quick Evaluation

## 2023-08-08 NOTE — Transfer of Care (Addendum)
Immediate Anesthesia Transfer of Care Note  Patient: MARCINDA LEAVERTON  Procedure(s) Performed: COLONOSCOPY WITH PROPOFOL  Patient Location: Endoscopy Unit  Anesthesia Type:General  Level of Consciousness: drowsy and patient cooperative  Airway & Oxygen Therapy: Patient Spontanous Breathing and Patient connected to face mask oxygen  Post-op Assessment: Report given to RN and Post -op Vital signs reviewed and stable  Post vital signs: Reviewed and stable  Last Vitals:  Vitals Value Taken Time  BP 102/56 08/08/23 0846  Temp 36.1 C 08/08/23 0845  Pulse 71 08/08/23 0848  Resp 18 08/08/23 0848  SpO2 100 % 08/08/23 0848  Vitals shown include unfiled device data.  Last Pain:  Vitals:   08/08/23 0845  TempSrc: Tympanic  PainSc: Asleep         Complications: No notable events documented.

## 2023-08-08 NOTE — Interval H&P Note (Signed)
History and Physical Interval Note:  08/08/2023 8:17 AM  Kayla Wade  has presented today for surgery, with the diagnosis of bowel habit changes,incontinenece of feces,.  The various methods of treatment have been discussed with the patient and family. After consideration of risks, benefits and other options for treatment, the patient has consented to  Procedure(s) with comments: COLONOSCOPY WITH PROPOFOL (N/A) - DM as a surgical intervention.  The patient's history has been reviewed, patient examined, no change in status, stable for surgery.  I have reviewed the patient's chart and labs.  Questions were answered to the patient's satisfaction.     Regis Bill  Ok to proceed with colonoscopy

## 2023-08-08 NOTE — Op Note (Signed)
[facility] Gastroenterology Patient Name: Kayla Wade Procedure Date: 08/08/2023 8:07 AM MRN: [id number] Account #: [id number] Date of Birth: [birth year] Admit Type: Outpatient Age: 72 Room: [facility] ENDO ROOM 1 Gender: Female Note Status: Finalized Instrument Name: Nelda Marseille [id number] Procedure:             Colonoscopy Indications:           Change in bowel habits, Fecal incontinence Providers:             Eather Colas MD, MD Medicines:             Monitored Anesthesia Care Complications:         No immediate complications. Procedure:             Pre-Anesthesia Assessment:                        - Prior to the procedure, a History and Physical was                         performed, and patient medications and allergies were                         reviewed. The patient is competent. The risks and                         benefits of the procedure and the sedation options and                         risks were discussed with the patient. All questions                         were answered and informed consent was obtained.                         Patient identification and proposed procedure were                         verified by the physician, the nurse, the                         anesthesiologist, the anesthetist and the technician                         in the endoscopy suite. Mental Status Examination:                         alert and oriented. Airway Examination: normal                         oropharyngeal airway and neck mobility. Respiratory                         Examination: clear to auscultation. CV Examination:                         normal. Prophylactic Antibiotics: The patient does not                         require prophylactic antibiotics. Prior  Anticoagulants: The patient has taken no anticoagulant                         or antiplatelet agents. ASA Grade Assessment: III - A                         patient with severe  systemic disease. After reviewing                         the risks and benefits, the patient was deemed in                         satisfactory condition to undergo the procedure. The                         anesthesia plan was to use monitored anesthesia care                         (MAC). Immediately prior to administration of                         medications, the patient was re-assessed for adequacy                         to receive sedatives. The heart rate, respiratory                         rate, oxygen saturations, blood pressure, adequacy of                         pulmonary ventilation, and response to care were                         monitored throughout the procedure. The physical                         status of the patient was re-assessed after the                         procedure.                        After obtaining informed consent, the colonoscope was                         passed under direct vision. Throughout the procedure,                         the patient's blood pressure, pulse, and oxygen                         saturations were monitored continuously. The                         Colonoscope was introduced through the anus and                         advanced to the the cecum, identified by appendiceal  orifice and ileocecal valve. The colonoscopy was                         somewhat difficult due to a redundant colon and                         significant looping. Successful completion of the                         procedure was aided by applying abdominal pressure.                         The patient tolerated the procedure well. The quality                         of the bowel preparation was adequate to identify                         polyps. The ileocecal valve, appendiceal orifice, and                         rectum were photographed. Findings:      The perianal and digital rectal examinations were normal.      The  entire examined colon appeared normal.      Internal hemorrhoids were found during retroflexion. The hemorrhoids       were Grade I (internal hemorrhoids that do not prolapse). Impression:            - The entire examined colon is normal.                        - Internal hemorrhoids.                        - No specimens collected. Recommendation:        - Repeat colonoscopy in 5 years for surveillance.                        - Return to referring physician as previously                         scheduled.                        - Discharge patient to home.                        - Resume previous diet.                        - Continue present medications. Procedure Code(s):     --- Professional ---                        [phone], Colonoscopy, flexible; diagnostic, including                         collection of specimen(s) by brushing or washing, when                         performed (separate procedure) Diagnosis Code(s):     ---  Professional ---                        K64.0, First degree hemorrhoids                        R19.4, Change in bowel habit                        R15.9, Full incontinence of feces CPT copyright 2022 [employer]. All rights reserved. The codes documented in this report are preliminary and upon coder review may  be revised to meet current compliance requirements. Eather Colas MD, MD 08/08/2023 8:51:42 AM Number of Addenda: 0 Note Initiated On: 08/08/2023 8:07 AM Scope Withdrawal Time: 0 hours 7 minutes 8 seconds  Total Procedure Duration: 0 hours 20 minutes 23 seconds  Estimated Blood Loss:  Estimated blood loss: none.      [facility]

## 2023-08-08 NOTE — H&P (Signed)
Outpatient short stay form Pre-procedure 08/08/2023  Kayla Bill, MD  Primary Physician: [facility]  Reason for visit:  Fecal incontinence/Bowel habit changes  History of present illness:    72 y/o lady with history of CAD, hypertension, and DM II here for colonoscopy due to fecal incontinence/bowel habit changes. Looks like last colonoscopy was in 2019 with some polyps removed and possible rectal stricture?Marland Kitchen No blood thinners. No family history of GI malignancies. History of hysterectomy and cholecystectomy.    Current Facility-Administered Medications:    0.9 %  sodium chloride infusion, , Intravenous, Continuous, Yojan Paskett, Rossie Muskrat, MD, Last Rate: 20 mL/hr at 08/08/23 0738, New Bag at 08/08/23 0738  Medications Prior to Admission  Medication Sig Dispense Refill Last Dose   aspirin EC 81 MG tablet Take 81 mg by mouth daily. Swallow whole.   08/07/2023   atorvastatin (LIPITOR) 40 MG tablet Take 40 mg by mouth daily.   08/07/2023   enalapril (VASOTEC) 20 MG tablet Take 5 mg by mouth 2 (two) times daily.   08/07/2023   famotidine (PEPCID) 10 MG tablet Take 10 mg by mouth daily.   08/07/2023   ferrous fumarate-b12-vitamic C-folic acid (TRINSICON / FOLTRIN) capsule Take 1 capsule by mouth 2 (two) times daily after a meal. 20 capsule 0 Past Week   hydrochlorothiazide (MICROZIDE) 12.5 MG capsule Take 12.5 mg by mouth daily.   08/07/2023   ipratropium (ATROVENT) 0.03 % nasal spray Place 2 sprays into both nostrils every 12 (twelve) hours.      magnesium oxide (MAG-OX) 400 (240 Mg) MG tablet Take 400 mg by mouth QID.   Past Week   Multiple Vitamins-Minerals (MULTIVITAMIN WITH MINERALS) tablet Take 1 tablet by mouth daily.   Past Week   Semaglutide (RYBELSUS) 14 MG TABS Take by mouth.   08/04/2023   Semaglutide 14 MG TABS Take 14 mg by mouth daily.      SLOW-MAG 71.5-119 MG TBEC SR tablet Take 1 tablet by mouth daily.   Past Week   vitamin B-12 (CYANOCOBALAMIN) 1000 MCG tablet Take  2,000 mcg by mouth daily.   Past Week   acetaminophen (TYLENOL) 500 MG tablet Take 1,000 mg by mouth every 8 (eight) hours as needed for mild pain.      albuterol (VENTOLIN HFA) 108 (90 Base) MCG/ACT inhaler Inhale 2 puffs into the lungs every 6 (six) hours as needed for wheezing or shortness of breath.      docusate sodium (COLACE) 100 MG capsule Take 1 capsule (100 mg total) by mouth 2 (two) times daily. 10 capsule 0    enoxaparin (LOVENOX) 40 MG/0.4ML injection Inject 0.4 mLs (40 mg total) into the skin daily for 14 days. 5.6 mL 0    Fluticasone Propionate, Inhal, 100 MCG/ACT AEPB Inhale 2 puffs into the lungs 2 (two) times daily.      meclizine (ANTIVERT) 25 MG tablet Take 1 tablet (25 mg total) by mouth 3 (three) times daily as needed for dizziness. 30 tablet 0    metFORMIN (GLUCOPHAGE-XR) 500 MG 24 hr tablet Take 500 mg by mouth 2 (two) times daily.      polyethylene glycol (MIRALAX / GLYCOLAX) 17 g packet Take 17 g by mouth daily as needed for mild constipation. (Patient not taking: Reported on 05/19/2022) 14 each 0    risperiDONE (RISPERDAL) 0.5 MG tablet Take 0.5 mg by mouth at bedtime.      traMADol (ULTRAM) 50 MG tablet Take 1 tablet (50 mg total) by mouth  every 6 (six) hours as needed for moderate pain. 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    Physical Exam  Gen: Alert, oriented. Appears stated age.  HEENT: PERRLA. Lungs: No respiratory distress CV: RRR Abd: soft, benign, no masses Ext: No edema    Planned procedures: Proceed with colonoscopy. The patient understands the nature of the planned procedure, indications, risks, alternatives and potential complications including but not limited to bleeding, infection, perforation, damage to internal organs and possible oversedation/side effects from anesthesia. The patient agrees and gives consent to proceed.  Please refer to procedure notes for findings, recommendations and patient disposition/instructions.     Kayla Bill, MD [facility] Gastroenterology

## 2023-08-08 NOTE — Anesthesia Procedure Notes (Signed)
Procedure Name: General with mask airway Date/Time: 08/08/2023 8:20 AM  Performed by: Mohammed Kindle, CRNAPre-anesthesia Checklist: Patient identified, Emergency Drugs available, Suction available and Patient being monitored Patient Re-evaluated:Patient Re-evaluated prior to induction Oxygen Delivery Method: Simple face mask Induction Type: IV induction Placement Confirmation: positive ETCO2, CO2 detector and breath sounds checked- equal and bilateral Dental Injury: Teeth and Oropharynx as per pre-operative assessment

## 2023-08-09 ENCOUNTER — Encounter: Payer: Self-pay | Admitting: Gastroenterology

## 2023-08-15 DIAGNOSIS — K219 Gastro-esophageal reflux disease without esophagitis: Secondary | ICD-10-CM | POA: Diagnosis not present

## 2023-09-04 ENCOUNTER — Ambulatory Visit
Admission: RE | Admit: 2023-09-04 | Discharge: 2023-09-04 | Disposition: A | Discharge status: Home / Self Care | Payer: Medicare HMO | Source: Ambulatory Visit | Attending: Internal Medicine | Admitting: Internal Medicine

## 2023-09-04 DIAGNOSIS — Z1231 Encounter for screening mammogram for malignant neoplasm of breast: Secondary | ICD-10-CM | POA: Insufficient documentation

## 2023-09-08 DIAGNOSIS — J069 Acute upper respiratory infection, unspecified: Secondary | ICD-10-CM | POA: Diagnosis not present

## 2023-09-08 DIAGNOSIS — R059 Cough, unspecified: Secondary | ICD-10-CM | POA: Diagnosis not present

## 2023-09-08 DIAGNOSIS — B9689 Other specified bacterial agents as the cause of diseases classified elsewhere: Secondary | ICD-10-CM | POA: Diagnosis not present

## 2023-10-27 ENCOUNTER — Encounter: Payer: Self-pay | Admitting: *Deleted

## 2023-11-01 DIAGNOSIS — M546 Pain in thoracic spine: Secondary | ICD-10-CM | POA: Diagnosis not present

## 2023-11-01 DIAGNOSIS — M9901 Segmental and somatic dysfunction of cervical region: Secondary | ICD-10-CM | POA: Diagnosis not present

## 2023-11-01 DIAGNOSIS — M9902 Segmental and somatic dysfunction of thoracic region: Secondary | ICD-10-CM | POA: Diagnosis not present

## 2023-11-01 DIAGNOSIS — M5412 Radiculopathy, cervical region: Secondary | ICD-10-CM | POA: Diagnosis not present

## 2023-11-02 DIAGNOSIS — E1169 Type 2 diabetes mellitus with other specified complication: Secondary | ICD-10-CM | POA: Diagnosis not present

## 2023-11-02 DIAGNOSIS — M1990 Unspecified osteoarthritis, unspecified site: Secondary | ICD-10-CM | POA: Diagnosis not present

## 2023-11-02 DIAGNOSIS — E1165 Type 2 diabetes mellitus with hyperglycemia: Secondary | ICD-10-CM | POA: Diagnosis not present

## 2023-11-02 DIAGNOSIS — J45909 Unspecified asthma, uncomplicated: Secondary | ICD-10-CM | POA: Diagnosis not present

## 2023-11-02 DIAGNOSIS — G8929 Other chronic pain: Secondary | ICD-10-CM | POA: Diagnosis not present

## 2023-11-02 DIAGNOSIS — I10 Essential (primary) hypertension: Secondary | ICD-10-CM | POA: Diagnosis not present

## 2023-11-02 DIAGNOSIS — E785 Hyperlipidemia, unspecified: Secondary | ICD-10-CM | POA: Diagnosis not present

## 2023-11-02 DIAGNOSIS — Z7984 Long term (current) use of oral hypoglycemic drugs: Secondary | ICD-10-CM | POA: Diagnosis not present

## 2023-11-02 DIAGNOSIS — E663 Overweight: Secondary | ICD-10-CM | POA: Diagnosis not present

## 2023-11-02 DIAGNOSIS — R42 Dizziness and giddiness: Secondary | ICD-10-CM | POA: Diagnosis not present

## 2023-11-02 DIAGNOSIS — F319 Bipolar disorder, unspecified: Secondary | ICD-10-CM | POA: Diagnosis not present

## 2023-11-08 IMAGING — US US FNA BIOPSY THYROID 1ST LESION
1 series · 13 of 21 positions shown · non-contrast
Comparison: US Thyroid 11/09/2021

MEDICATIONS:
Local Lidocaine 1%

COMPLICATIONS:
None immediate.

INDICATION: Indeterminate thyroid nodule

EXAM:
ULTRASOUND GUIDED FINE NEEDLE ASPIRATION OF INDETERMINATE THYROID
NODULE
TECHNIQUE: Informed written consent was obtained from the patient after a
discussion of the risks, benefits and alternatives to treatment.
Questions regarding the procedure were encouraged and answered. A
timeout was performed prior to the initiation of the procedure.

[Series 1: us fna biopsy thyroid 1st lesion · 0.07mm/px · 21 acquisitions, 13 frames shown]
[im 1/21]
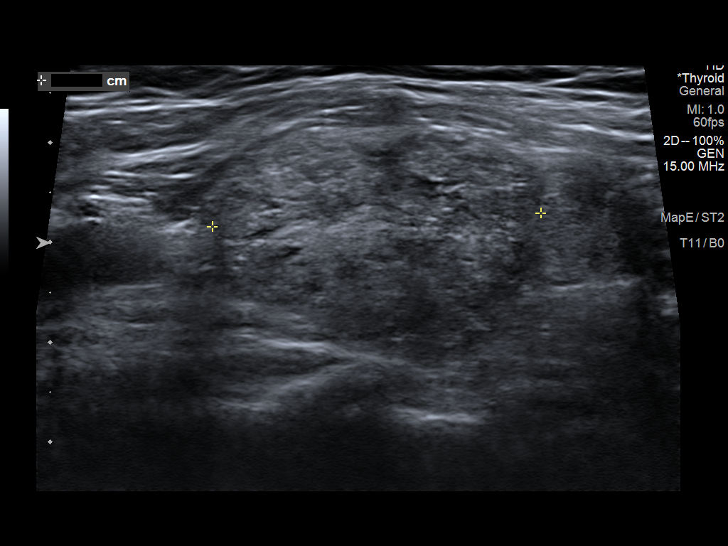
[im 3/21]
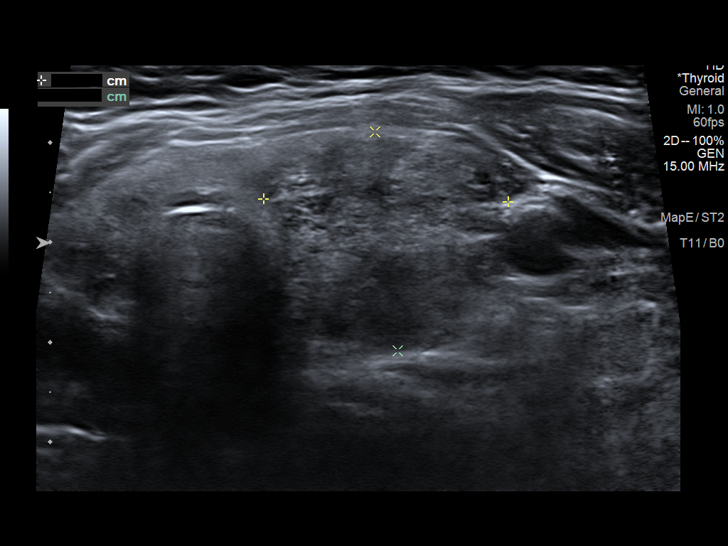
[im 5/21]
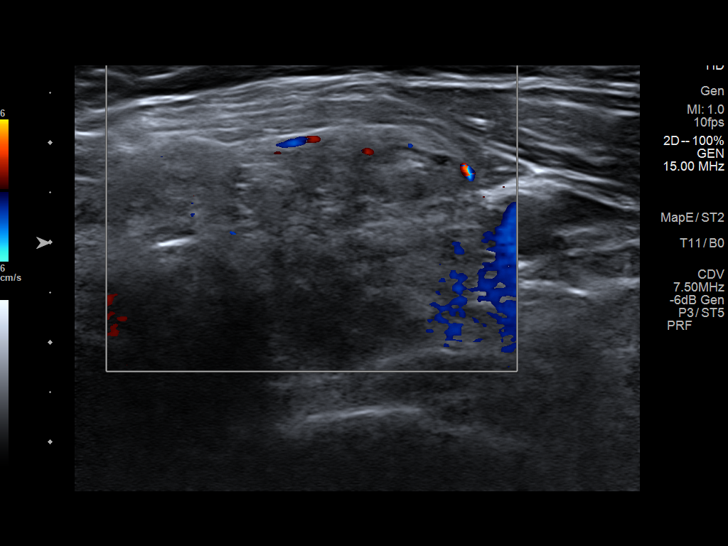
[im 6/21]
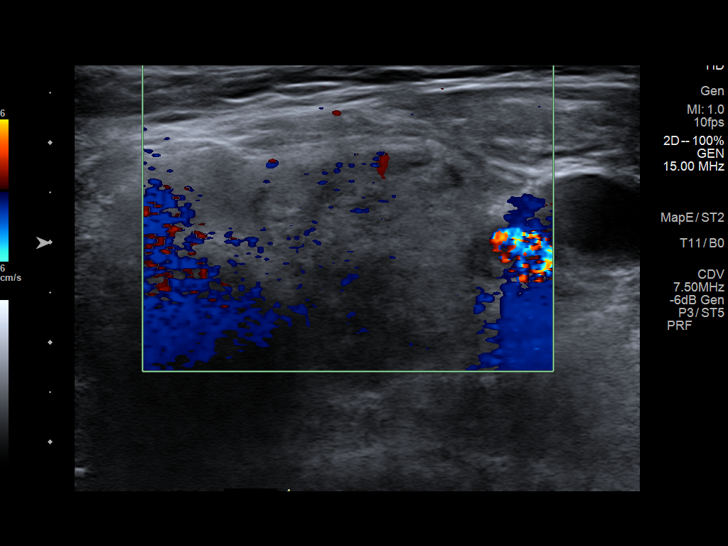
[im 8/21]
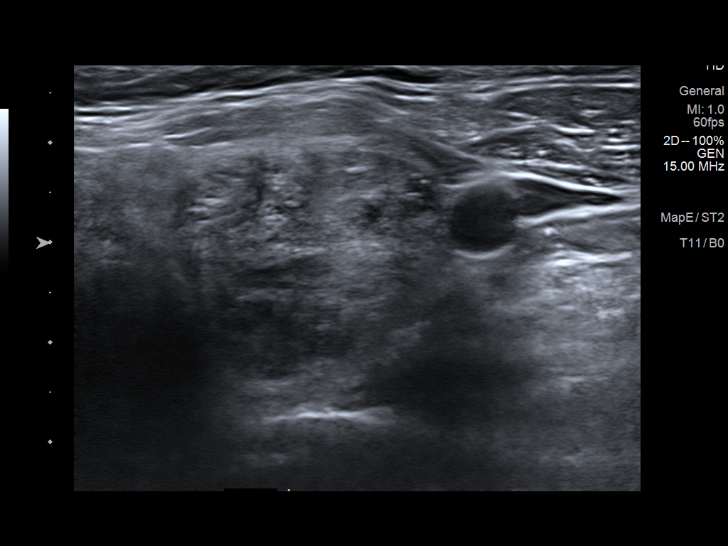
[im 9/21]
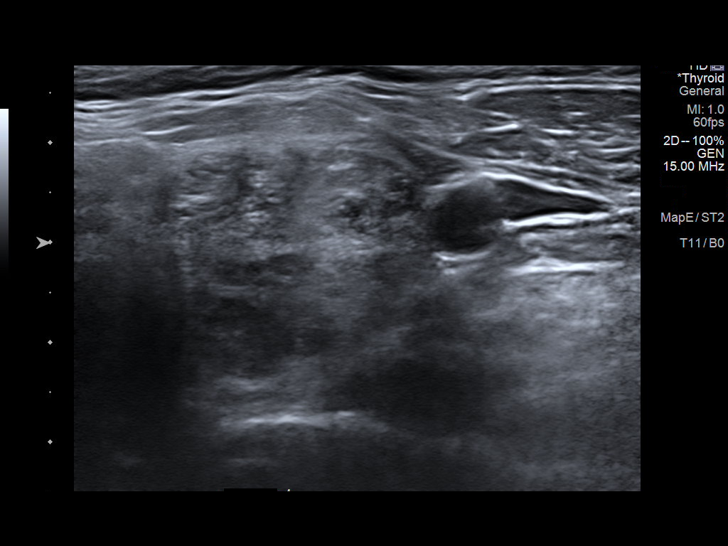
[im 11/21]
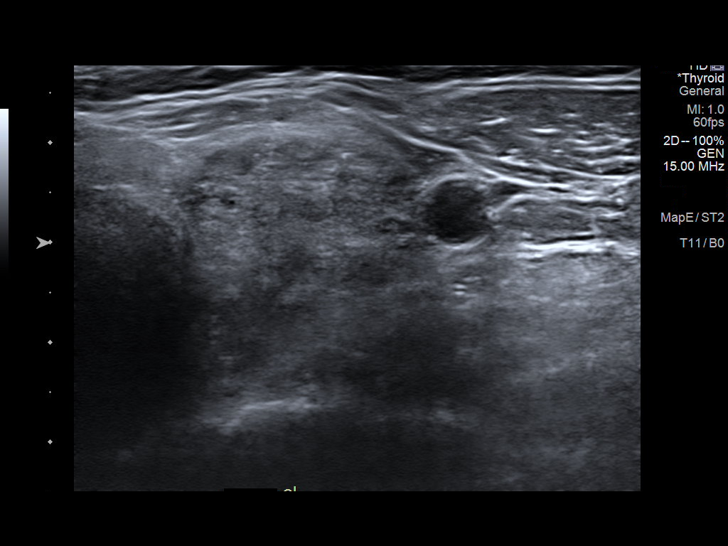
[im 13/21]
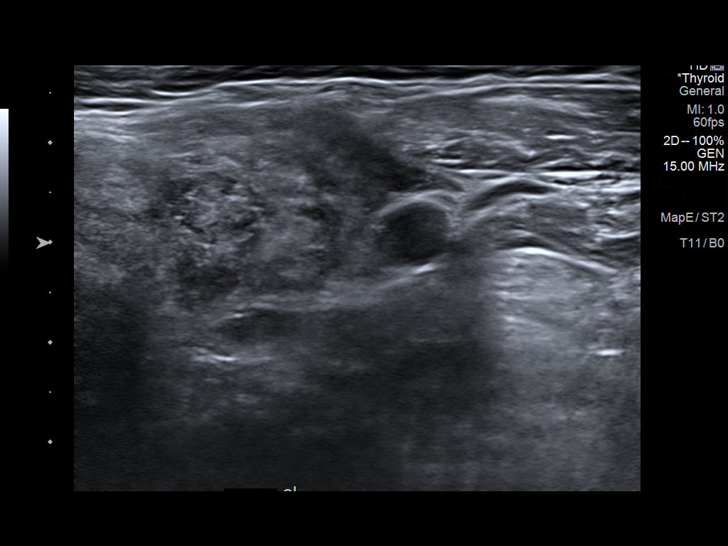
[im 14/21]
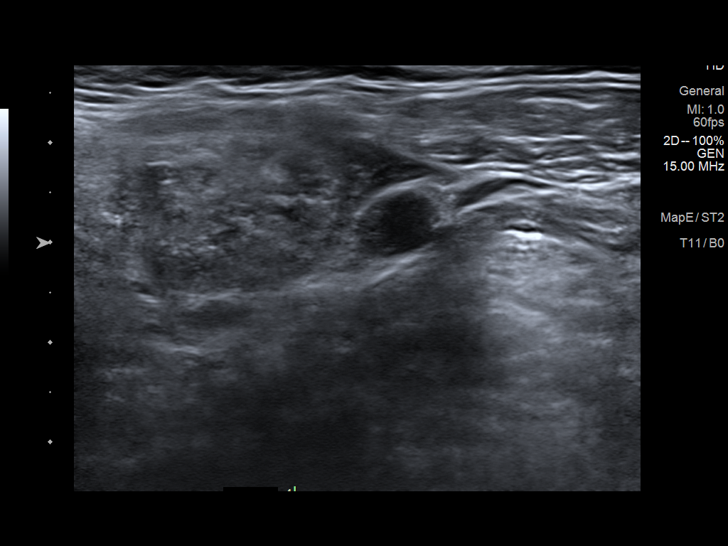
[im 16/21]
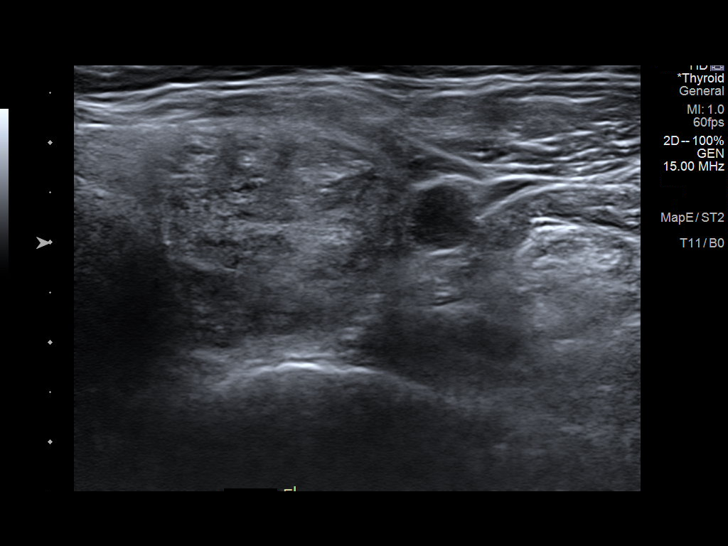
[im 17/21]
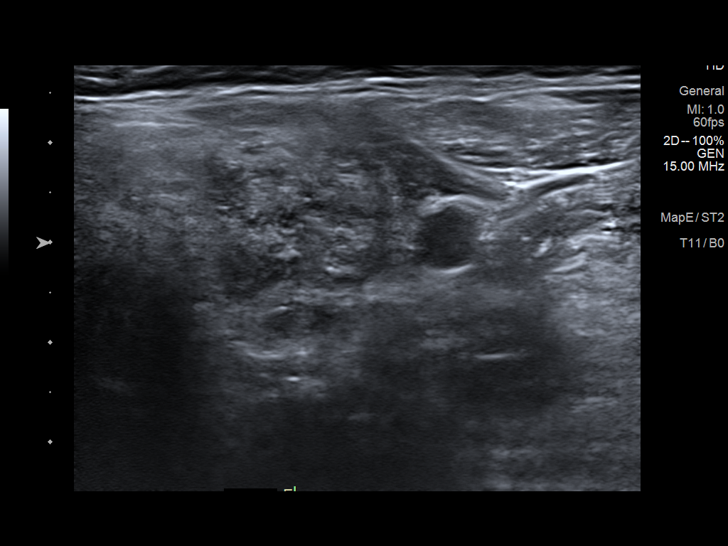
[im 19/21]
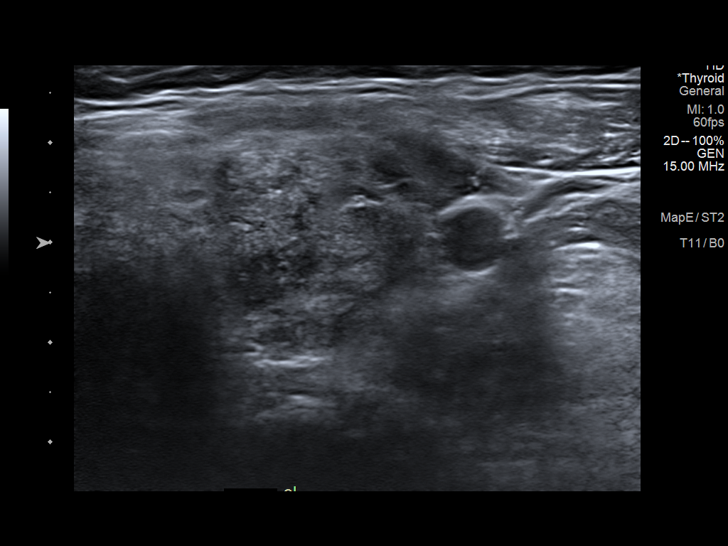
[im 21/21]
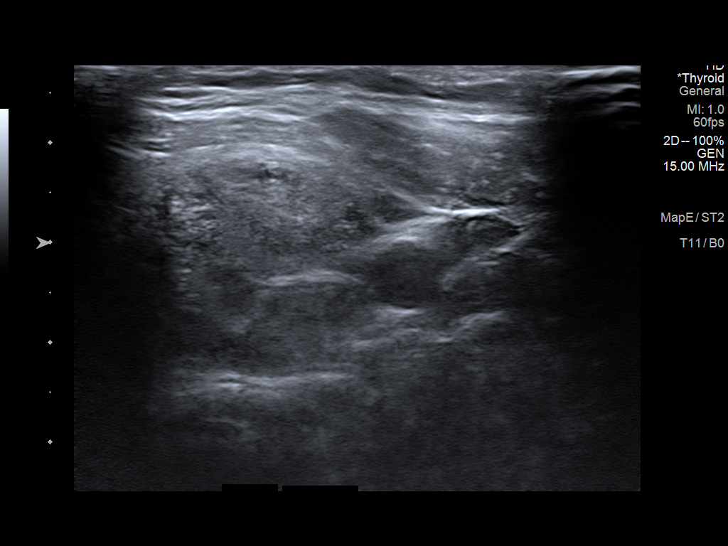

[13 of 21 positions shown; findings below may reference images not displayed]

Pre-procedural ultrasound scanning demonstrated unchanged size and
appearance of the indeterminate nodule within the left thyroid lobe.

The procedure was planned. The neck was prepped in the usual sterile
fashion, and a sterile drape was applied covering the operative
field. A timeout was performed prior to the initiation of the
procedure. Local anesthesia was provided with 1% lidocaine.

Under direct ultrasound guidance, 6 FNA biopsies were performed of
the left thyroid nodule with a 25 gauge needle. Multiple ultrasound
images were saved for procedural documentation purposes. The samples
were prepared and submitted to pathology.

Limited post procedural scanning was negative for hematoma or
additional complication. Dressings were placed. The patient
tolerated the above procedures procedure well without immediate
postprocedural complication.
FINDINGS: FINDINGS
Nodule reference number based on prior diagnostic ultrasound: 3

Maximum size: 3.5 cm

Location: Left

ACR TI-RADS total points: 3

ACR TI-RADS risk category:  TR3 (3 points)

Prior biopsy:  No

Reason for biopsy: meets ACR TI-RADS criteria

Ultrasound imaging confirms appropriate placement of the needles
within the thyroid nodule.
IMPRESSION: Technically successful ultrasound guided fine needle aspiration of
the 3.5 cm left thyroid nodule.

This exam was performed by Klpigbb Moolman, and was supervised
and interpreted by Dr. Iyalla.

## 2023-11-13 ENCOUNTER — Ambulatory Visit: Admit: 2023-11-13 | Payer: HMO

## 2023-11-13 SURGERY — ESOPHAGOGASTRODUODENOSCOPY (EGD) WITH PROPOFOL
Anesthesia: General

## 2023-11-17 DIAGNOSIS — E1165 Type 2 diabetes mellitus with hyperglycemia: Secondary | ICD-10-CM | POA: Diagnosis not present

## 2023-11-17 DIAGNOSIS — E538 Deficiency of other specified B group vitamins: Secondary | ICD-10-CM | POA: Diagnosis not present

## 2023-11-17 DIAGNOSIS — D649 Anemia, unspecified: Secondary | ICD-10-CM | POA: Diagnosis not present

## 2023-11-17 DIAGNOSIS — E7800 Pure hypercholesterolemia, unspecified: Secondary | ICD-10-CM | POA: Diagnosis not present

## 2023-11-17 DIAGNOSIS — E041 Nontoxic single thyroid nodule: Secondary | ICD-10-CM | POA: Diagnosis not present

## 2023-11-23 DIAGNOSIS — E1165 Type 2 diabetes mellitus with hyperglycemia: Secondary | ICD-10-CM | POA: Diagnosis not present

## 2023-11-23 DIAGNOSIS — E7800 Pure hypercholesterolemia, unspecified: Secondary | ICD-10-CM | POA: Diagnosis not present

## 2023-11-23 DIAGNOSIS — I2510 Atherosclerotic heart disease of native coronary artery without angina pectoris: Secondary | ICD-10-CM | POA: Diagnosis not present

## 2023-11-23 DIAGNOSIS — F330 Major depressive disorder, recurrent, mild: Secondary | ICD-10-CM | POA: Diagnosis not present

## 2023-11-23 DIAGNOSIS — E538 Deficiency of other specified B group vitamins: Secondary | ICD-10-CM | POA: Diagnosis not present

## 2023-11-23 DIAGNOSIS — I10 Essential (primary) hypertension: Secondary | ICD-10-CM | POA: Diagnosis not present

## 2023-11-26 DIAGNOSIS — R0981 Nasal congestion: Secondary | ICD-10-CM | POA: Diagnosis not present

## 2023-11-26 DIAGNOSIS — J029 Acute pharyngitis, unspecified: Secondary | ICD-10-CM | POA: Diagnosis not present

## 2023-11-30 DIAGNOSIS — E7800 Pure hypercholesterolemia, unspecified: Secondary | ICD-10-CM | POA: Diagnosis not present

## 2023-11-30 DIAGNOSIS — I2510 Atherosclerotic heart disease of native coronary artery without angina pectoris: Secondary | ICD-10-CM | POA: Diagnosis not present

## 2023-11-30 DIAGNOSIS — E1165 Type 2 diabetes mellitus with hyperglycemia: Secondary | ICD-10-CM | POA: Diagnosis not present

## 2023-11-30 DIAGNOSIS — I10 Essential (primary) hypertension: Secondary | ICD-10-CM | POA: Diagnosis not present

## 2023-12-07 DIAGNOSIS — H04123 Dry eye syndrome of bilateral lacrimal glands: Secondary | ICD-10-CM | POA: Diagnosis not present

## 2023-12-07 DIAGNOSIS — E119 Type 2 diabetes mellitus without complications: Secondary | ICD-10-CM | POA: Diagnosis not present

## 2023-12-07 DIAGNOSIS — H353132 Nonexudative age-related macular degeneration, bilateral, intermediate dry stage: Secondary | ICD-10-CM | POA: Diagnosis not present

## 2023-12-07 DIAGNOSIS — H2513 Age-related nuclear cataract, bilateral: Secondary | ICD-10-CM | POA: Diagnosis not present

## 2023-12-13 ENCOUNTER — Encounter: Payer: Self-pay | Admitting: Ophthalmology

## 2023-12-13 DIAGNOSIS — H2511 Age-related nuclear cataract, right eye: Secondary | ICD-10-CM | POA: Diagnosis not present

## 2023-12-13 DIAGNOSIS — H2512 Age-related nuclear cataract, left eye: Secondary | ICD-10-CM | POA: Diagnosis not present

## 2023-12-13 DIAGNOSIS — Z9889 Other specified postprocedural states: Secondary | ICD-10-CM | POA: Diagnosis not present

## 2023-12-13 NOTE — Anesthesia Preprocedure Evaluation (Addendum)
 Anesthesia Evaluation  Patient identified by MRN, date of birth, ID band Patient awake    Reviewed: Allergy & Precautions, H&P , NPO status , Patient's Chart, lab work & pertinent test results  History of Anesthesia Complications (+) PONV and history of anesthetic complications  Airway Mallampati: IV       Dental  (+) Upper Dentures, Partial Lower   Pulmonary neg pulmonary ROS, asthma           Cardiovascular hypertension, + CAD       Neuro/Psych  Headaches PSYCHIATRIC DISORDERS Anxiety Depression Bipolar Disorder   negative neurological ROS  negative psych ROS   GI/Hepatic negative GI ROS, Neg liver ROS,GERD  ,,  Endo/Other  diabetes    Renal/GU Renal diseasenegative Renal ROS  negative genitourinary   Musculoskeletal negative musculoskeletal ROS (+) Arthritis ,    Abdominal   Peds negative pediatric ROS (+)  Hematology negative hematology ROS (+)   Anesthesia Other Findings Hx colonoscopy 08-08-23 Dr. Mazzoni  Cardiology note 06/15/23:  73 y.o. female with  1. Sinus bradycardia  2. Essential hypertension  3. Coronary artery disease involving native coronary artery of native heart without angina pectoris  4. Exertional dyspnea  5. Type 2 diabetes mellitus with hyperglycemia, without long-term current use of insulin  (CMS/HHS-HCC)  6. Pure hypercholesterolemia  7. Dizziness and giddiness    73 year old female initially referred for bradycardia, secondary to propanolol, resolved after discontinuation of propanolol. 72-hour Holter monitor revealed predominant sinus rhythm with mean heart rate of 77 bpm, frequent PACs and PVCs, 2 brief atrial runs. Patient has essential hypertension, blood pressure low today. She reported a several month history of progressively worsening exertional dyspnea, exercise intolerance, without chest pain, or lower extremity edema.   2D echocardiogram revealed normal LV function with  mild MR. ETT Myoview was negative for evidence of scar or ischemia. Cardiac CTA showed mild nonobstructive CAD. She had recent sudden onset of ataxia and blurred vision, was evaluated at [facility] ER with negative work up including brain MRI, CTA head and neck, and labs. Meclizine  helped.   She was evaluated by ENT who prescribed Z-pack for cough and congestion. She had a syncope episode the day she started the antibiotic, with prodromal tunnel vision. At a recent visit, she reported more frequent episodes of dizziness, occurring at rest, unrelated to position changes or exertion. Holter monitor was unremarkable. Dizziness resolved completely after discontinuation of HCTZ. She has been followed by ENT for dizziness, has undergone Dix-Hallpike maneuver for dizziness with resolution. Blood pressure is well controlled.    Return in about 1 year (around 06/14/2024).      Reproductive/Obstetrics negative OB ROS                              Anesthesia Physical Anesthesia Plan  ASA: 3  Anesthesia Plan: MAC   Post-op Pain Management:    Induction: Intravenous  PONV Risk Score and Plan:   Airway Management Planned: Natural Airway and Nasal Cannula  Additional Equipment:   Intra-op Plan:   Post-operative Plan:   Informed Consent: I have reviewed the patients History and Physical, chart, labs and discussed the procedure including the risks, benefits and alternatives for the proposed anesthesia with the patient or authorized representative who has indicated his/her understanding and acceptance.     Dental Advisory Given  Plan Discussed with: Anesthesiologist, CRNA and Surgeon  Anesthesia Plan Comments: (Patient consented for risks of anesthesia including but  not limited to:  - adverse reactions to medications - damage to eyes, teeth, lips or other oral mucosa - nerve damage due to positioning  - sore throat or hoarseness - Damage to heart, brain, nerves, lungs,  other parts of body or loss of life  Patient voiced understanding and assent.)         Anesthesia Quick Evaluation

## 2023-12-29 DIAGNOSIS — E119 Type 2 diabetes mellitus without complications: Secondary | ICD-10-CM | POA: Diagnosis not present

## 2023-12-29 DIAGNOSIS — Z7982 Long term (current) use of aspirin: Secondary | ICD-10-CM | POA: Diagnosis not present

## 2023-12-29 DIAGNOSIS — Z882 Allergy status to sulfonamides status: Secondary | ICD-10-CM | POA: Diagnosis not present

## 2023-12-29 DIAGNOSIS — E785 Hyperlipidemia, unspecified: Secondary | ICD-10-CM | POA: Diagnosis not present

## 2023-12-29 DIAGNOSIS — Z885 Allergy status to narcotic agent status: Secondary | ICD-10-CM | POA: Diagnosis not present

## 2023-12-29 DIAGNOSIS — I10 Essential (primary) hypertension: Secondary | ICD-10-CM | POA: Diagnosis not present

## 2023-12-29 DIAGNOSIS — Z7984 Long term (current) use of oral hypoglycemic drugs: Secondary | ICD-10-CM | POA: Diagnosis not present

## 2023-12-29 DIAGNOSIS — Z881 Allergy status to other antibiotic agents status: Secondary | ICD-10-CM | POA: Diagnosis not present

## 2023-12-29 DIAGNOSIS — M545 Low back pain, unspecified: Secondary | ICD-10-CM | POA: Diagnosis not present

## 2023-12-29 DIAGNOSIS — Z79899 Other long term (current) drug therapy: Secondary | ICD-10-CM | POA: Diagnosis not present

## 2024-01-02 NOTE — Discharge Instructions (Signed)

## 2024-01-03 ENCOUNTER — Encounter
Admission: RE | Disposition: A | Discharge status: Home / Self Care | Payer: Self-pay | Source: Home / Self Care | Attending: Ophthalmology

## 2024-01-03 ENCOUNTER — Ambulatory Visit: Payer: Self-pay | Admitting: Anesthesiology

## 2024-01-03 ENCOUNTER — Ambulatory Visit
Admission: RE | Admit: 2024-01-03 | Discharge: 2024-01-03 | Disposition: A | Discharge status: Home / Self Care | Attending: Ophthalmology | Admitting: Ophthalmology

## 2024-01-03 ENCOUNTER — Encounter: Payer: Self-pay | Admitting: Ophthalmology

## 2024-01-03 ENCOUNTER — Other Ambulatory Visit: Payer: Self-pay

## 2024-01-03 DIAGNOSIS — M1990 Unspecified osteoarthritis, unspecified site: Secondary | ICD-10-CM | POA: Insufficient documentation

## 2024-01-03 DIAGNOSIS — H2511 Age-related nuclear cataract, right eye: Secondary | ICD-10-CM | POA: Insufficient documentation

## 2024-01-03 DIAGNOSIS — I129 Hypertensive chronic kidney disease with stage 1 through stage 4 chronic kidney disease, or unspecified chronic kidney disease: Secondary | ICD-10-CM | POA: Diagnosis not present

## 2024-01-03 DIAGNOSIS — E1122 Type 2 diabetes mellitus with diabetic chronic kidney disease: Secondary | ICD-10-CM | POA: Diagnosis not present

## 2024-01-03 DIAGNOSIS — I2510 Atherosclerotic heart disease of native coronary artery without angina pectoris: Secondary | ICD-10-CM | POA: Insufficient documentation

## 2024-01-03 DIAGNOSIS — F419 Anxiety disorder, unspecified: Secondary | ICD-10-CM | POA: Diagnosis not present

## 2024-01-03 DIAGNOSIS — I10 Essential (primary) hypertension: Secondary | ICD-10-CM | POA: Diagnosis not present

## 2024-01-03 DIAGNOSIS — E1136 Type 2 diabetes mellitus with diabetic cataract: Secondary | ICD-10-CM | POA: Diagnosis not present

## 2024-01-03 DIAGNOSIS — F319 Bipolar disorder, unspecified: Secondary | ICD-10-CM | POA: Insufficient documentation

## 2024-01-03 DIAGNOSIS — Z7984 Long term (current) use of oral hypoglycemic drugs: Secondary | ICD-10-CM | POA: Diagnosis not present

## 2024-01-03 DIAGNOSIS — Z87891 Personal history of nicotine dependence: Secondary | ICD-10-CM | POA: Diagnosis not present

## 2024-01-03 DIAGNOSIS — N1831 Chronic kidney disease, stage 3a: Secondary | ICD-10-CM | POA: Diagnosis not present

## 2024-01-03 DIAGNOSIS — J45909 Unspecified asthma, uncomplicated: Secondary | ICD-10-CM | POA: Diagnosis not present

## 2024-01-03 HISTORY — PX: CATARACT EXTRACTION W/PHACO: SHX586

## 2024-01-03 HISTORY — DX: Dizziness and giddiness: R42

## 2024-01-03 LAB — GLUCOSE, CAPILLARY: Glucose-Capillary: 131 mg/dL — ABNORMAL HIGH (ref 70–99)

## 2024-01-03 SURGERY — PHACOEMULSIFICATION, CATARACT, WITH IOL INSERTION
Anesthesia: Monitor Anesthesia Care | Site: Eye | Laterality: Right

## 2024-01-03 MED ORDER — FENTANYL CITRATE (PF) 100 MCG/2ML IJ SOLN
INTRAMUSCULAR | Status: DC | PRN
  Administered 2024-01-03: 50 ug via INTRAVENOUS

## 2024-01-03 MED ORDER — TETRACAINE HCL 0.5 % OP SOLN
1.0000 [drp] | OPHTHALMIC | Status: DC | PRN
  Administered 2024-01-03 (×3): 1 [drp] via OPHTHALMIC

## 2024-01-03 MED ORDER — SODIUM CHLORIDE 0.9% FLUSH
INTRAVENOUS | Status: DC | PRN
  Administered 2024-01-03 (×2): 10 mL via INTRAVENOUS

## 2024-01-03 MED ORDER — MIDAZOLAM HCL 2 MG/2ML IJ SOLN
INTRAMUSCULAR | Status: DC | PRN
Start: 2024-01-03 — End: 2024-01-03
  Administered 2024-01-03: 1.5 mg via INTRAVENOUS
  Administered 2024-01-03: .5 mg via INTRAVENOUS

## 2024-01-03 MED ORDER — SIGHTPATH DOSE#1 NA HYALUR & NA CHOND-NA HYALUR IO KIT
PACK | INTRAOCULAR | Status: DC | PRN
  Administered 2024-01-03: 1 via OPHTHALMIC

## 2024-01-03 MED ORDER — MIDAZOLAM HCL 2 MG/2ML IJ SOLN
INTRAMUSCULAR | Status: AC
  Filled 2024-01-03: qty 2

## 2024-01-03 MED ORDER — SIGHTPATH DOSE#1 BSS IO SOLN
INTRAOCULAR | Status: DC | PRN
  Administered 2024-01-03: 63 mL via OPHTHALMIC

## 2024-01-03 MED ORDER — BRIMONIDINE TARTRATE-TIMOLOL 0.2-0.5 % OP SOLN
OPHTHALMIC | Status: DC | PRN
  Administered 2024-01-03: 1 [drp] via OPHTHALMIC

## 2024-01-03 MED ORDER — CEFUROXIME OPHTHALMIC INJECTION 1 MG/0.1 ML
INJECTION | OPHTHALMIC | Status: DC | PRN
  Administered 2024-01-03: 1 mg via INTRACAMERAL

## 2024-01-03 MED ORDER — FENTANYL CITRATE (PF) 100 MCG/2ML IJ SOLN
INTRAMUSCULAR | Status: AC
  Filled 2024-01-03: qty 2

## 2024-01-03 MED ORDER — TETRACAINE HCL 0.5 % OP SOLN
OPHTHALMIC | Status: AC
  Filled 2024-01-03: qty 4

## 2024-01-03 MED ORDER — ARMC OPHTHALMIC DILATING DROPS
OPHTHALMIC | Status: AC
  Filled 2024-01-03: qty 0.5

## 2024-01-03 MED ORDER — LIDOCAINE HCL (PF) 2 % IJ SOLN
INTRAOCULAR | Status: DC | PRN
  Administered 2024-01-03: 2 mL

## 2024-01-03 MED ORDER — ARMC OPHTHALMIC DILATING DROPS
1.0000 | OPHTHALMIC | Status: DC | PRN
  Administered 2024-01-03 (×3): 1 via OPHTHALMIC

## 2024-01-03 MED ORDER — SIGHTPATH DOSE#1 BSS IO SOLN
INTRAOCULAR | Status: DC | PRN
  Administered 2024-01-03: 15 mL via INTRAOCULAR

## 2024-01-03 SURGICAL SUPPLY — 10 items
CATARACT SUITE SIGHTPATH (MISCELLANEOUS) ×1 IMPLANT
FEE CATARACT SUITE SIGHTPATH (MISCELLANEOUS) ×2 IMPLANT
GLOVE BIOGEL PI IND STRL 8 (GLOVE) ×2 IMPLANT
GLOVE SURG LX STRL 7.5 STRW (GLOVE) ×2 IMPLANT
GLOVE SURG PROTEXIS BL SZ6.5 (GLOVE) ×1 IMPLANT
GLOVE SURG SYN 6.5 PF PI BL (GLOVE) ×2 IMPLANT
LENS IOL TECNIS EYHANCE 23.0 (Intraocular Lens) IMPLANT
NDL FILTER BLUNT 18X1 1/2 (NEEDLE) ×2 IMPLANT
NEEDLE FILTER BLUNT 18X1 1/2 (NEEDLE) ×1 IMPLANT
SYR 3ML LL SCALE MARK (SYRINGE) ×2 IMPLANT

## 2024-01-03 NOTE — Transfer of Care (Signed)
 Immediate Anesthesia Transfer of Care Note  Patient: Kayla Wade  Procedure(s) Performed: PHACOEMULSIFICATION, CATARACT, WITH IOL INSERTION 3.65 00:21.1 (Right: Eye)  Patient Location: PACU  Anesthesia Type:MAC  Level of Consciousness: awake, alert , and oriented  Airway & Oxygen Therapy: Patient Spontanous Breathing  Post-op Assessment: Report given to RN and Post -op Vital signs reviewed and stable  Post vital signs: Reviewed and stable  Last Vitals: nl temp  Vitals Value Taken Time  BP 119/60 01/03/24 0957  Temp    Pulse 73 01/03/24 0959  Resp 14 01/03/24 0959  SpO2 97 % 01/03/24 0959  Vitals shown include unfiled device data.  Last Pain:  Vitals:   01/03/24 0851  PainSc: 0-No pain         Complications: No notable events documented.

## 2024-01-03 NOTE — H&P (Signed)
 [facility]   Primary Care Physician:  [facility] Ophthalmologist: Dr. Annell Kidney  Pre-Procedure History & Physical: HPI:  Kayla Wade is a 73 y.o. adult here for ophthalmic surgery.   Past Medical History:  Diagnosis Date   Acid reflux    Anxiety    Arachnoid cyst    Arthritis    Asthma    Bipolar disorder (HCC)    Bradycardia    Coronary artery disease    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   Depression    Dizziness    inner ear issues   Exertional dyspnea    Headache    Heart palpitations    Hepatic steatosis    High cholesterol    History of kidney stones    h/o   Hypertension    Low magnesium  level    Low serum vitamin B12    PAC (premature atrial contraction)    PONV (postoperative nausea and vomiting)    nausea only   PVC's (premature ventricular contractions)    Sinus bradycardia    Thyroid  nodule    Type 2 diabetes mellitus (HCC)     Past Surgical History:  Procedure Laterality Date   ABDOMINAL HYSTERECTOMY     partial   ACETABULAR REVISION Left 06/16/2022   Procedure: LEFT HIP ACETABULAR REVISION;  Surgeon: Venus Ginsberg, MD;  Location: [facility] OR[facility];  Service: Orthopedics;  Laterality: Left;   BRAIN SURGERY  2010   due to cyst-done at [facility]-Pt has shunt   BREAST BIOPSY Left 01/22/2013   BENIGN BREAST TISSUE WITH FIBROCYSTIC CHANGES INCLUDING USUAL   BREAST SURGERY Right 1998   right breast biopsy   CHOLECYSTECTOMY     COLONOSCOPY  2006   [facility], Connye Delaine, MD   COLONOSCOPY WITH PROPOFOL  N/A 04/11/2018   Procedure: COLONOSCOPY WITH PROPOFOL ;  Surgeon: Marshall Skeeter, MD;  Location: [facility];  Service: Endoscopy;  Laterality: N/A;   COLONOSCOPY WITH PROPOFOL  N/A 08/08/2023   Procedure: COLONOSCOPY WITH PROPOFOL ;  Surgeon: Shane Darling, MD;  Location: [facility];  Service: Endoscopy;  Laterality: N/A;  DM   CRANIOTOMY N/A    ELBOW SURGERY Right    H/O craniotomy      JOINT REPLACEMENT     KIDNEY STONE SURGERY N/A    LASIK     TONSILLECTOMY     ADENOIDS X 2   TOTAL HIP ARTHROPLASTY Left 06/24/2021   Procedure: TOTAL HIP ARTHROPLASTY ANTERIOR APPROACH;  Surgeon: Molli Angelucci, MD;  Location: [facility];  Service: Orthopedics;  Laterality: Left;   TUBAL LIGATION  1976    Prior to Admission medications   Medication Sig Start Date End Date Taking? Authorizing Provider  acetaminophen  (TYLENOL ) 500 MG tablet Take 1,000 mg by mouth every 8 (eight) hours as needed for mild pain.   Yes [provider]  albuterol  (VENTOLIN  HFA) 108 (90 Base) MCG/ACT inhaler Inhale 2 puffs into the lungs every 6 (six) hours as needed for wheezing or shortness of breath.   Yes [provider]  aspirin EC 81 MG tablet Take 81 mg by mouth daily. Swallow whole.   Yes [provider]  atorvastatin  (LIPITOR) 40 MG tablet Take 40 mg by mouth daily. 03/04/22  Yes [provider]  enalapril  (VASOTEC ) 20 MG tablet Take 10 mg by mouth daily. 06/04/21  Yes [provider]  esomeprazole (NEXIUM) 20 MG packet Take 20 mg by mouth at bedtime.   Yes [provider]  Fluticasone Propionate, Inhal, 100 MCG/ACT AEPB Inhale 2 puffs into the lungs 2 (two) times daily.   Yes [provider]  magnesium  oxide (MAG-OX) 400 (240 Mg) MG tablet Take 400 mg by mouth QID. 02/08/21  Yes [provider]  meclizine  (ANTIVERT ) 25 MG tablet Take 1 tablet (25 mg total) by mouth 3 (three) times daily as needed for dizziness. 07/12/22  Yes Lubertha Rush, MD  metFORMIN  (GLUCOPHAGE -XR) 500 MG 24 hr tablet Take 500 mg by mouth 2 (two) times daily. 10/09/15 01/03/24 Yes [provider]  Multiple Vitamins-Minerals (MULTIVITAMIN WITH MINERALS) tablet Take 1 tablet by mouth daily.   Yes [provider]  Multiple Vitamins-Minerals (PRESERVISION AREDS 2 PO) Take by mouth in the morning and at bedtime.   Yes [provider]  risperiDONE   (RISPERDAL ) 0.5 MG tablet Take 0.5 mg by mouth at bedtime.   Yes [provider]  SLOW-MAG 71.5-119 MG TBEC SR tablet Take 1 tablet by mouth daily. 04/27/21  Yes [provider]  vitamin B-12 (CYANOCOBALAMIN ) 1000 MCG tablet Take 2,000 mcg by mouth in the morning and at bedtime.   Yes [provider]  docusate sodium  (COLACE) 100 MG capsule Take 1 capsule (100 mg total) by mouth 2 (two) times daily. Patient not taking: Reported on 12/13/2023 06/17/22   [name], PA-C  polyethylene glycol (MIRALAX  / GLYCOLAX ) 17 g packet Take 17 g by mouth daily as needed for mild constipation. Patient not taking: Reported on 05/19/2022 06/25/21   [name], PA-C    Allergies as of 12/11/2023 - Review Complete 10/27/2023  Allergen Reaction Noted   Sulfa antibiotics Nausea And Vomiting 07/18/2023   Tussionex pennkinetic er [hydrocod poli-chlorphe poli er] Nausea Only 06/18/2021   Zolpidem Other (See Comments) 03/07/2021   Erythromycin base Rash     Family History  Problem Relation Age of Onset   Lung cancer Sister    Breast cancer Neg Hx     Social History   Socioeconomic History   Marital status: Widowed    Spouse name: Not on file   Number of children: Not on file   Years of education: Not on file   Highest education level: Not on file  Occupational History   Not on file  Tobacco Use   Smoking status: Never   Smokeless tobacco: Former    Types: Chew    Quit date: 2010  Vaping Use   Vaping status: Never Used  Substance and Sexual Activity   Alcohol use: Yes    Comment: rare   Drug use: Never   Sexual activity: Not on file  Other Topics Concern   Not on file  Social History Narrative   LIVES ALONE   Social Drivers of Health   Financial Resource Strain: Low Risk  (05/25/2023)   Received [facility]   Overall Financial Resource Strain (CARDIA)    Difficulty of Paying Living Expenses: Not hard at all  Food Insecurity: No  Food Insecurity (05/25/2023)   Received from [facility]   Hunger Vital Sign    Worried About Running Out of Food in the Last Year: Never true    Ran Out of Food in the Last Year: Never true  Transportation Needs: No Transportation Needs (05/25/2023)   Received from [facility] - Transportation    In the past 12 months, has lack of transportation kept you from medical appointments or from getting  medications?: No    Lack of Transportation (Non-Medical): No  Physical Activity: Not on file  Stress: Not on file  Social Connections: Not on file  Intimate Partner Violence: Not At Risk (06/16/2022)   Humiliation, Afraid, Rape, and Kick questionnaire    Fear of Current or Ex-Partner: No    Emotionally Abused: No    Physically Abused: No    Sexually Abused: No    Review of Systems: See HPI, otherwise negative ROS  Physical Exam: Ht 5\' 4"  (1.626 m)   Wt 71.7 kg   BMI 27.12 kg/m  General:   Alert,  pleasant and cooperative in NAD Head:  Normocephalic and atraumatic. Lungs:  Clear to auscultation.    Heart:  Regular rate and rhythm.   Impression/Plan: Kayla Wade is here for ophthalmic surgery.  Risks, benefits, limitations, and alternatives regarding ophthalmic surgery have been reviewed with the patient.  Questions have been answered.  All parties agreeable.   Annell Kidney, MD  01/03/2024, 8:48 AM

## 2024-01-03 NOTE — Anesthesia Postprocedure Evaluation (Signed)
 Anesthesia Post Note  Patient: Kayla Wade  Procedure(s) Performed: PHACOEMULSIFICATION, CATARACT, WITH IOL INSERTION 3.65 00:21.1 (Right: Eye)  Patient location during evaluation: PACU Anesthesia Type: MAC Level of consciousness: awake and alert Pain management: pain level controlled Vital Signs Assessment: post-procedure vital signs reviewed and stable Respiratory status: spontaneous breathing, nonlabored ventilation, respiratory function stable and patient connected to nasal cannula oxygen Cardiovascular status: stable and blood pressure returned to baseline Postop Assessment: no apparent nausea or vomiting Anesthetic complications: no   No notable events documented.   Last Vitals:  Vitals:   01/03/24 1000 01/03/24 1002  BP: 119/60 124/71  Pulse: 77 74  Resp: 13 16  Temp:  36.5 C  SpO2: 97% 97%    Last Pain:  Vitals:   01/03/24 1002  PainSc: 0-No pain                 Karis Emig C Gerardo Caiazzo

## 2024-01-03 NOTE — Op Note (Signed)
 LOCATION:  [facility]   PREOPERATIVE DIAGNOSIS:    Nuclear sclerotic cataract right eye. H25.11   POSTOPERATIVE DIAGNOSIS:  Nuclear sclerotic cataract right eye.     PROCEDURE:  Phacoemusification with posterior chamber intraocular lens placement of the right eye   ULTRASOUND TIME: Procedure(s): PHACOEMULSIFICATION, CATARACT, WITH IOL INSERTION 3.65 00:21.1 (Right)  LENS:   Implant Name Type Inv. Item Serial No. Manufacturer Lot No. LRB No. Used Action  LENS IOL TECNIS EYHANCE 23.0 - [id number] Intraocular Lens LENS IOL TECNIS EYHANCE 23.0 [id number] [employer]  Right 1 Implanted         SURGEON:  Berline Brenner, MD   ANESTHESIA:  Topical with tetracaine drops and 2% Xylocaine  jelly, augmented with 1% preservative-free intracameral lidocaine .    COMPLICATIONS:  None.   DESCRIPTION OF PROCEDURE:  The patient was identified in the holding room and transported to the operating room and placed in the supine position under the operating microscope.  The right eye was identified as the operative eye and it was prepped and draped in the usual sterile ophthalmic fashion.   A 1 millimeter clear-corneal paracentesis was made at the 12:00 position.  0.5 ml of preservative-free 1% lidocaine  was injected into the anterior chamber. The anterior chamber was filled with Viscoat viscoelastic.  A 2.4 millimeter keratome was used to make a near-clear corneal incision at the 9:00 position.  A curvilinear capsulorrhexis was made with a cystotome and capsulorrhexis forceps.  Balanced salt solution was used to hydrodissect and hydrodelineate the nucleus.   Phacoemulsification was then used in stop and chop fashion to remove the lens nucleus and epinucleus.  The remaining cortex was then removed using the irrigation and aspiration handpiece. Provisc was then placed into the capsular bag to distend it for lens placement.  A lens was then injected into the capsular bag.  The remaining  viscoelastic was aspirated.   Wounds were hydrated with balanced salt solution.  The anterior chamber was inflated to a physiologic pressure with balanced salt solution.  No wound leaks were noted. Cefuroxime 0.1 ml of a 10mg /ml solution was injected into the anterior chamber for a dose of 1 mg of intracameral antibiotic at the completion of the case.   Timolol and Brimonidine drops were applied to the eye.  The patient was taken to the recovery room in stable condition without complications of anesthesia or surgery.   Kijana Estock 01/03/2024, 9:56 AM

## 2024-01-04 DIAGNOSIS — H2512 Age-related nuclear cataract, left eye: Secondary | ICD-10-CM | POA: Diagnosis not present

## 2024-01-08 ENCOUNTER — Encounter: Payer: Self-pay | Admitting: Ophthalmology

## 2024-01-09 NOTE — Anesthesia Preprocedure Evaluation (Addendum)
 Anesthesia Evaluation  Patient identified by MRN, date of birth, ID band Patient awake    Reviewed: Allergy & Precautions, H&P , NPO status , Patient's Chart, lab work & pertinent test results  History of Anesthesia Complications (+) PONV and history of anesthetic complications  Airway Mallampati: IV  TM Distance: >3 FB Neck ROM: Full    Dental no notable dental hx.    Pulmonary neg pulmonary ROS, shortness of breath, asthma , sleep apnea    Pulmonary exam normal breath sounds clear to auscultation       Cardiovascular hypertension, + CAD  negative cardio ROS Normal cardiovascular exam Rhythm:Regular Rate:Normal     Neuro/Psych  Headaches PSYCHIATRIC DISORDERS Anxiety Depression Bipolar Disorder   negative neurological ROS  negative psych ROS   GI/Hepatic negative GI ROS, Neg liver ROS,GERD  ,,  Endo/Other  negative endocrine ROSdiabetes    Renal/GU Renal diseasenegative Renal ROS  negative genitourinary   Musculoskeletal negative musculoskeletal ROS (+) Arthritis ,    Abdominal   Peds negative pediatric ROS (+)  Hematology negative hematology ROS (+)   Anesthesia Other Findings Previous cataract surgery 01-03-24  High cholesterol  Acid reflux Bipolar disorder (HCC) Hypertension PAC (premature atrial contraction) PVC's (premature ventricular contractions) Bradycardia  Type 2 diabetes mellitus (HCC) Low magnesium  level History of kidney stones Arthritis  PONV (postoperative nausea and vomiting) Cyst of brain Asthma Anxiety Depression Hepatic steatosis Thyroid  nodule Coronary artery disease  Exertional dyspnea Heart palpitations  Sinus bradycardia Low serum vitamin B12  Arachnoid cyst Headache  Dizziness Sleep apnea  Dyspnea    Reproductive/Obstetrics negative OB ROS                              Anesthesia Physical Anesthesia Plan  ASA: 3  Anesthesia Plan: MAC    Post-op Pain Management:    Induction: Intravenous  PONV Risk Score and Plan:   Airway Management Planned: Natural Airway and Nasal Cannula  Additional Equipment:   Intra-op Plan:   Post-operative Plan:   Informed Consent: I have reviewed the patients History and Physical, chart, labs and discussed the procedure including the risks, benefits and alternatives for the proposed anesthesia with the patient or authorized representative who has indicated his/her understanding and acceptance.     Dental Advisory Given  Plan Discussed with: Anesthesiologist, CRNA and Surgeon  Anesthesia Plan Comments: (Patient consented for risks of anesthesia including but not limited to:  - adverse reactions to medications - damage to eyes, teeth, lips or other oral mucosa - nerve damage due to positioning  - sore throat or hoarseness - Damage to heart, brain, nerves, lungs, other parts of body or loss of life  Patient voiced understanding and assent.)         Anesthesia Quick Evaluation

## 2024-01-16 NOTE — Discharge Instructions (Signed)

## 2024-01-17 ENCOUNTER — Ambulatory Visit: Payer: Self-pay | Admitting: Anesthesiology

## 2024-01-17 ENCOUNTER — Encounter
Admission: RE | Disposition: A | Discharge status: Home / Self Care | Payer: Self-pay | Source: Home / Self Care | Attending: Ophthalmology

## 2024-01-17 ENCOUNTER — Other Ambulatory Visit: Payer: Self-pay

## 2024-01-17 ENCOUNTER — Encounter: Payer: Self-pay | Admitting: Ophthalmology

## 2024-01-17 ENCOUNTER — Ambulatory Visit
Admission: RE | Admit: 2024-01-17 | Discharge: 2024-01-17 | Disposition: A | Discharge status: Home / Self Care | Attending: Ophthalmology | Admitting: Ophthalmology

## 2024-01-17 DIAGNOSIS — K219 Gastro-esophageal reflux disease without esophagitis: Secondary | ICD-10-CM | POA: Insufficient documentation

## 2024-01-17 DIAGNOSIS — I10 Essential (primary) hypertension: Secondary | ICD-10-CM | POA: Insufficient documentation

## 2024-01-17 DIAGNOSIS — E1165 Type 2 diabetes mellitus with hyperglycemia: Secondary | ICD-10-CM | POA: Diagnosis not present

## 2024-01-17 DIAGNOSIS — I2510 Atherosclerotic heart disease of native coronary artery without angina pectoris: Secondary | ICD-10-CM | POA: Insufficient documentation

## 2024-01-17 DIAGNOSIS — Z7984 Long term (current) use of oral hypoglycemic drugs: Secondary | ICD-10-CM | POA: Diagnosis not present

## 2024-01-17 DIAGNOSIS — H2511 Age-related nuclear cataract, right eye: Secondary | ICD-10-CM | POA: Diagnosis not present

## 2024-01-17 DIAGNOSIS — G4730 Sleep apnea, unspecified: Secondary | ICD-10-CM | POA: Insufficient documentation

## 2024-01-17 DIAGNOSIS — J45909 Unspecified asthma, uncomplicated: Secondary | ICD-10-CM | POA: Diagnosis not present

## 2024-01-17 DIAGNOSIS — E1122 Type 2 diabetes mellitus with diabetic chronic kidney disease: Secondary | ICD-10-CM | POA: Diagnosis not present

## 2024-01-17 DIAGNOSIS — E1136 Type 2 diabetes mellitus with diabetic cataract: Secondary | ICD-10-CM | POA: Diagnosis not present

## 2024-01-17 DIAGNOSIS — N1831 Chronic kidney disease, stage 3a: Secondary | ICD-10-CM | POA: Diagnosis not present

## 2024-01-17 DIAGNOSIS — I129 Hypertensive chronic kidney disease with stage 1 through stage 4 chronic kidney disease, or unspecified chronic kidney disease: Secondary | ICD-10-CM | POA: Diagnosis not present

## 2024-01-17 DIAGNOSIS — H2512 Age-related nuclear cataract, left eye: Secondary | ICD-10-CM | POA: Diagnosis not present

## 2024-01-17 DIAGNOSIS — Z7985 Long-term (current) use of injectable non-insulin antidiabetic drugs: Secondary | ICD-10-CM | POA: Insufficient documentation

## 2024-01-17 HISTORY — DX: Sleep apnea, unspecified: G47.30

## 2024-01-17 HISTORY — PX: CATARACT EXTRACTION W/PHACO: SHX586

## 2024-01-17 HISTORY — DX: Dyspnea, unspecified: R06.00

## 2024-01-17 LAB — GLUCOSE, CAPILLARY: Glucose-Capillary: 139 mg/dL — ABNORMAL HIGH (ref 70–99)

## 2024-01-17 SURGERY — PHACOEMULSIFICATION, CATARACT, WITH IOL INSERTION
Anesthesia: Monitor Anesthesia Care | Site: Eye | Laterality: Left

## 2024-01-17 MED ORDER — SIGHTPATH DOSE#1 NA HYALUR & NA CHOND-NA HYALUR IO KIT
PACK | INTRAOCULAR | Status: DC | PRN
  Administered 2024-01-17: 1 via OPHTHALMIC

## 2024-01-17 MED ORDER — TETRACAINE HCL 0.5 % OP SOLN
1.0000 [drp] | OPHTHALMIC | Status: DC | PRN
  Administered 2024-01-17 (×3): 1 [drp] via OPHTHALMIC

## 2024-01-17 MED ORDER — SIGHTPATH DOSE#1 BSS IO SOLN
INTRAOCULAR | Status: DC | PRN
  Administered 2024-01-17: 15 mL via INTRAOCULAR

## 2024-01-17 MED ORDER — ONDANSETRON HCL 4 MG/2ML IJ SOLN
4.0000 mg | Freq: Once | INTRAMUSCULAR | Status: AC
  Administered 2024-01-17: 4 mg via INTRAVENOUS

## 2024-01-17 MED ORDER — MIDAZOLAM HCL 2 MG/2ML IJ SOLN
INTRAMUSCULAR | Status: AC
  Filled 2024-01-17: qty 2

## 2024-01-17 MED ORDER — CEFUROXIME OPHTHALMIC INJECTION 1 MG/0.1 ML
INJECTION | OPHTHALMIC | Status: DC | PRN
Start: 2024-01-17 — End: 2024-01-17
  Administered 2024-01-17: 1 mg via INTRACAMERAL

## 2024-01-17 MED ORDER — SIGHTPATH DOSE#1 BSS IO SOLN
INTRAOCULAR | Status: DC | PRN
  Administered 2024-01-17: 56 mL via OPHTHALMIC

## 2024-01-17 MED ORDER — TETRACAINE HCL 0.5 % OP SOLN
OPHTHALMIC | Status: AC
  Filled 2024-01-17: qty 4

## 2024-01-17 MED ORDER — BRIMONIDINE TARTRATE-TIMOLOL 0.2-0.5 % OP SOLN
OPHTHALMIC | Status: DC | PRN
  Administered 2024-01-17: 1 [drp] via OPHTHALMIC

## 2024-01-17 MED ORDER — LIDOCAINE HCL (PF) 2 % IJ SOLN
INTRAOCULAR | Status: DC | PRN
  Administered 2024-01-17: 2 mL

## 2024-01-17 MED ORDER — ARMC OPHTHALMIC DILATING DROPS
1.0000 | OPHTHALMIC | Status: DC | PRN
  Administered 2024-01-17 (×3): 1 via OPHTHALMIC

## 2024-01-17 MED ORDER — MIDAZOLAM HCL 2 MG/2ML IJ SOLN
INTRAMUSCULAR | Status: DC | PRN
  Administered 2024-01-17 (×2): 1 mg via INTRAVENOUS

## 2024-01-17 MED ORDER — ONDANSETRON HCL 4 MG/2ML IJ SOLN
INTRAMUSCULAR | Status: AC
  Filled 2024-01-17: qty 2

## 2024-01-17 MED ORDER — FENTANYL CITRATE (PF) 100 MCG/2ML IJ SOLN
INTRAMUSCULAR | Status: AC
  Filled 2024-01-17: qty 2

## 2024-01-17 MED ORDER — ARMC OPHTHALMIC DILATING DROPS
OPHTHALMIC | Status: AC
  Filled 2024-01-17: qty 0.5

## 2024-01-17 MED ORDER — FENTANYL CITRATE (PF) 100 MCG/2ML IJ SOLN
INTRAMUSCULAR | Status: DC | PRN
  Administered 2024-01-17 (×2): 50 ug via INTRAVENOUS

## 2024-01-17 SURGICAL SUPPLY — 10 items
CATARACT SUITE SIGHTPATH (MISCELLANEOUS) ×1 IMPLANT
FEE CATARACT SUITE SIGHTPATH (MISCELLANEOUS) ×2 IMPLANT
GLOVE BIOGEL PI IND STRL 8 (GLOVE) ×2 IMPLANT
GLOVE SURG LX STRL 7.5 STRW (GLOVE) ×2 IMPLANT
GLOVE SURG PROTEXIS BL SZ6.5 (GLOVE) ×1 IMPLANT
GLOVE SURG SYN 6.5 PF PI BL (GLOVE) ×2 IMPLANT
LENS IOL TECNIS EYHANCE 22.5 (Intraocular Lens) IMPLANT
NDL FILTER BLUNT 18X1 1/2 (NEEDLE) ×2 IMPLANT
NEEDLE FILTER BLUNT 18X1 1/2 (NEEDLE) ×1 IMPLANT
SYR 3ML LL SCALE MARK (SYRINGE) ×2 IMPLANT

## 2024-01-17 NOTE — Transfer of Care (Signed)
 Immediate Anesthesia Transfer of Care Note  Patient: Kayla Wade  Procedure(s) Performed: PHACOEMULSIFICATION, CATARACT, WITH IOL INSERTION 6.14 00:29.9 (Left: Eye)  Patient Location: PACU  Anesthesia Type: MAC  Level of Consciousness: awake, alert  and patient cooperative  Airway and Oxygen Therapy: Patient Spontanous Breathing and Patient connected to supplemental oxygen  Post-op Assessment: Post-op Vital signs reviewed, Patient's Cardiovascular Status Stable, Respiratory Function Stable, Patent Airway and No signs of Nausea or vomiting  Post-op Vital Signs: Reviewed and stable  Complications: No notable events documented.

## 2024-01-17 NOTE — Op Note (Signed)
 LOCATION:  [facility]   PREOPERATIVE DIAGNOSIS:    Nuclear sclerotic cataract right eye. H25.11   POSTOPERATIVE DIAGNOSIS:  Nuclear sclerotic cataract right eye.     PROCEDURE:  Phacoemusification with posterior chamber intraocular lens placement of the right eye   ULTRASOUND TIME: Procedure(s): PHACOEMULSIFICATION, CATARACT, WITH IOL INSERTION 6.14 00:29.9 (Left)  LENS:   Implant Name Type Inv. Item Serial No. Manufacturer Lot No. LRB No. Used Action  LENS IOL TECNIS EYHANCE 22.5 - [id number] Intraocular Lens LENS IOL TECNIS EYHANCE 22.5 [id number] [employer]  Left 1 Implanted         SURGEON:  Berline Brenner, MD   ANESTHESIA:  Topical with tetracaine  drops and 2% Xylocaine  jelly, augmented with 1% preservative-free intracameral lidocaine .    COMPLICATIONS:  None.   DESCRIPTION OF PROCEDURE:  The patient was identified in the holding room and transported to the operating room and placed in the supine position under the operating microscope.  The right eye was identified as the operative eye and it was prepped and draped in the usual sterile ophthalmic fashion.   A 1 millimeter clear-corneal paracentesis was made at the 12:00 position.  0.5 ml of preservative-free 1% lidocaine  was injected into the anterior chamber. The anterior chamber was filled with Viscoat viscoelastic.  A 2.4 millimeter keratome was used to make a near-clear corneal incision at the 9:00 position.  A curvilinear capsulorrhexis was made with a cystotome and capsulorrhexis forceps.  Balanced salt  solution was used to hydrodissect and hydrodelineate the nucleus.   Phacoemulsification was then used in stop and chop fashion to remove the lens nucleus and epinucleus.  The remaining cortex was then removed using the irrigation and aspiration handpiece. Provisc was then placed into the capsular bag to distend it for lens placement.  A lens was then injected into the capsular bag.  The remaining  viscoelastic was aspirated.   Wounds were hydrated with balanced salt  solution.  The anterior chamber was inflated to a physiologic pressure with balanced salt  solution.  No wound leaks were noted. Cefuroxime  0.1 ml of a 10mg /ml solution was injected into the anterior chamber for a dose of 1 mg of intracameral antibiotic at the completion of the case.   Timolol  and Brimonidine  drops were applied to the eye.  The patient was taken to the recovery room in stable condition without complications of anesthesia or surgery.   Jevon Shells 01/17/2024, 7:52 AM

## 2024-01-17 NOTE — Anesthesia Postprocedure Evaluation (Signed)
 Anesthesia Post Note  Patient: Kayla Wade  Procedure(s) Performed: PHACOEMULSIFICATION, CATARACT, WITH IOL INSERTION 6.14 00:29.9 (Left: Eye)  Patient location during evaluation: PACU Anesthesia Type: MAC Level of consciousness: awake and alert Pain management: pain level controlled Vital Signs Assessment: post-procedure vital signs reviewed and stable Respiratory status: spontaneous breathing, nonlabored ventilation, respiratory function stable and patient connected to nasal cannula oxygen Cardiovascular status: stable and blood pressure returned to baseline Postop Assessment: no apparent nausea or vomiting Anesthetic complications: no   No notable events documented.   Last Vitals:  Vitals:   01/17/24 0753 01/17/24 0758  BP: (!) 114/49 117/62  Pulse: 79 80  Resp: (!) 22 18  Temp: (!) 36.3 C (!) 36.3 C  SpO2: 97% 95%    Last Pain:  Vitals:   01/17/24 0758  TempSrc:   PainSc: 0-No pain                 Johnell Bas C Jaylean Buenaventura

## 2024-01-17 NOTE — H&P (Signed)
 [facility]   Primary Care Physician:  [facility] Ophthalmologist: Dr. Annell Kidney  Pre-Procedure History & Physical: HPI:  STEELE Wade is a 73 y.o. adult here for ophthalmic surgery.   Past Medical History:  Diagnosis Date   Acid reflux    Anxiety    Arachnoid cyst    Arthritis    Asthma    Bipolar disorder (HCC)    Bradycardia    Coronary artery disease    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   Depression    Dizziness    inner ear issues   Dyspnea    Exertional dyspnea    Headache    Heart palpitations    Hepatic steatosis    High cholesterol    History of kidney stones    h/o   Hypertension    Low magnesium  level    Low serum vitamin B12    PAC (premature atrial contraction)    PONV (postoperative nausea and vomiting)    nausea only   PVC's (premature ventricular contractions)    Sinus bradycardia    Sleep apnea    Thyroid  nodule    Type 2 diabetes mellitus (HCC)     Past Surgical History:  Procedure Laterality Date   ABDOMINAL HYSTERECTOMY     partial   ACETABULAR REVISION Left 06/16/2022   Procedure: LEFT HIP ACETABULAR REVISION;  Surgeon: Venus Ginsberg, MD;  Location: [facility];  Service: Orthopedics;  Laterality: Left;   BRAIN SURGERY  2010   due to cyst-done at [facility]-Pt has shunt   BREAST BIOPSY Left 01/22/2013   BENIGN BREAST TISSUE WITH FIBROCYSTIC CHANGES INCLUDING USUAL   BREAST SURGERY Right 1998   right breast biopsy   CATARACT EXTRACTION W/PHACO Right 01/03/2024   Procedure: PHACOEMULSIFICATION, CATARACT, WITH IOL INSERTION 3.65 00:21.1;  Surgeon: Annell Kidney, MD;  Location: [facility];  Service: Ophthalmology;  Laterality: Right;   CHOLECYSTECTOMY     COLONOSCOPY  2006   [facility], Connye Delaine, MD   COLONOSCOPY WITH PROPOFOL  N/A 04/11/2018   Procedure: COLONOSCOPY WITH PROPOFOL ;  Surgeon: Marshall Skeeter, MD;  Location: [facility]COPY;  Service: Endoscopy;   Laterality: N/A;   COLONOSCOPY WITH PROPOFOL  N/A 08/08/2023   Procedure: COLONOSCOPY WITH PROPOFOL ;  Surgeon: Shane Darling, MD;  Location: [facility] ENDOSCOPY;  Service: Endoscopy;  Laterality: N/A;  DM   CRANIOTOMY N/A    ELBOW SURGERY Right    H/O craniotomy     JOINT REPLACEMENT     KIDNEY STONE SURGERY N/A    LASIK     TONSILLECTOMY     ADENOIDS X 2   TOTAL HIP ARTHROPLASTY Left 06/24/2021   Procedure: TOTAL HIP ARTHROPLASTY ANTERIOR APPROACH;  Surgeon: Molli Angelucci, MD;  Location: [facility];  Service: Orthopedics;  Laterality: Left;   TUBAL LIGATION  1976    Prior to Admission medications   Medication Sig Start Date End Date Taking? Authorizing Provider  acetaminophen  (TYLENOL ) 500 MG tablet Take 1,000 mg by mouth every 8 (eight) hours as needed for mild pain.   Yes [provider]  albuterol  (VENTOLIN  HFA) 108 (90 Base) MCG/ACT inhaler Inhale 2 puffs into the lungs every 6 (six) hours as needed for wheezing or shortness of breath.   Yes [provider]  aspirin EC 81 MG tablet Take 81 mg by mouth daily. Swallow whole.   Yes [provider]  atorvastatin  (LIPITOR) 40 MG tablet Take 40 mg by mouth daily.  03/04/22  Yes [provider]  enalapril  (VASOTEC ) 20 MG tablet Take 10 mg by mouth daily. 06/04/21  Yes [provider]  esomeprazole (NEXIUM) 20 MG packet Take 20 mg by mouth at bedtime.   Yes [provider]  Fluticasone Propionate, Inhal, 100 MCG/ACT AEPB Inhale 2 puffs into the lungs 2 (two) times daily.   Yes [provider]  magnesium  oxide (MAG-OX) 400 (240 Mg) MG tablet Take 400 mg by mouth QID. 02/08/21  Yes [provider]  meclizine  (ANTIVERT ) 25 MG tablet Take 1 tablet (25 mg total) by mouth 3 (three) times daily as needed for dizziness. Patient taking differently: Take 25 mg by mouth 2 (two) times daily. 07/12/22  Yes Lubertha Rush, MD  metFORMIN  (GLUCOPHAGE -XR) 500 MG 24 hr tablet Take 500 mg by mouth  2 (two) times daily. 10/09/15 01/08/24 Yes [provider]  Multiple Vitamins-Minerals (MULTIVITAMIN WITH MINERALS) tablet Take 1 tablet by mouth daily.   Yes [provider]  Multiple Vitamins-Minerals (PRESERVISION AREDS 2 PO) Take by mouth in the morning and at bedtime.   Yes [provider]  risperiDONE  (RISPERDAL ) 0.5 MG tablet Take 0.5 mg by mouth at bedtime.   Yes [provider]  Semaglutide (OZEMPIC, 0.25 OR 0.5 MG/DOSE, [place]) Inject 0.25 mg into the skin once a week.   Yes [provider]  SLOW-MAG 71.5-119 MG TBEC SR tablet Take 1 tablet by mouth daily. 04/27/21  Yes [provider]  vitamin B-12 (CYANOCOBALAMIN ) 1000 MCG tablet Take 2,000 mcg by mouth in the morning and at bedtime.   Yes [provider]  docusate sodium  (COLACE) 100 MG capsule Take 1 capsule (100 mg total) by mouth 2 (two) times daily. Patient not taking: Reported on 12/13/2023 06/17/22   Kayla Derry, PA-C  polyethylene glycol (MIRALAX  / GLYCOLAX ) 17 g packet Take 17 g by mouth daily as needed for mild constipation. Patient not taking: Reported on 05/19/2022 06/25/21   Kayla Derry, PA-C    Allergies as of 12/11/2023 - Review Complete 10/27/2023  Allergen Reaction Noted   Sulfa antibiotics Nausea And Vomiting 07/18/2023   Tussionex pennkinetic er [hydrocod poli-chlorphe poli er] Nausea Only 06/18/2021   Zolpidem Other (See Comments) 03/07/2021   Erythromycin base Rash     Family History  Problem Relation Age of Onset   Lung cancer Sister    Breast cancer Neg Hx     Social History   Socioeconomic History   Marital status: Widowed    Spouse name: Not on file   Number of children: Not on file   Years of education: Not on file   Highest education level: Not on file  Occupational History   Not on file  Tobacco Use   Smoking status: Never   Smokeless tobacco: Former    Types: Chew    Quit date: 2010  Vaping Use   Vaping status: Never Used   Substance and Sexual Activity   Alcohol use: Yes    Comment: rare   Drug use: Never   Sexual activity: Not on file  Other Topics Concern   Not on file  Social History Narrative   LIVES ALONE   Social Drivers of Health   Financial Resource Strain: Low Risk  (05/25/2023)   Received from [facility]   Overall Financial Resource Strain (CARDIA)    Difficulty of Paying Living Expenses: Not hard at all  Food Insecurity: No Food Insecurity (05/25/2023)   Received from  [facility]   Hunger Vital Sign    Worried About Running Out of Food in the Last Year: Never true    Ran Out of Food in the Last Year: Never true  Transportation Needs: No Transportation Needs (05/25/2023)   Received from [facility] - Transportation    In the past 12 months, has lack of transportation kept you from medical appointments or from getting medications?: No    Lack of Transportation (Non-Medical): No  Physical Activity: Not on file  Stress: Not on file  Social Connections: Not on file  Intimate Partner Violence: Not At Risk (06/16/2022)   Humiliation, Afraid, Rape, and Kick questionnaire    Fear of Current or Ex-Partner: No    Emotionally Abused: No    Physically Abused: No    Sexually Abused: No    Review of Systems: See HPI, otherwise negative ROS  Physical Exam: BP (!) 160/71   Pulse 73   Temp (!) 97.5 F (36.4 C) (Temporal)   Resp 10   Ht 5\' 4"  (1.626 m)   Wt 74.8 kg   SpO2 98%   BMI 28.29 kg/m  General:   Alert,  pleasant and cooperative in NAD Head:  Normocephalic and atraumatic. Lungs:  Clear to auscultation.    Heart:  Regular rate and rhythm.   Impression/Plan: Ane Banter is here for ophthalmic surgery.  Risks, benefits, limitations, and alternatives regarding ophthalmic surgery have been reviewed with the patient.  Questions have been answered.  All parties agreeable.   Annell Kidney, MD  01/17/2024, 7:30  AM

## 2024-01-18 ENCOUNTER — Encounter: Payer: Self-pay | Admitting: Ophthalmology

## 2024-05-22 DIAGNOSIS — E538 Deficiency of other specified B group vitamins: Secondary | ICD-10-CM | POA: Diagnosis not present

## 2024-05-22 DIAGNOSIS — E1165 Type 2 diabetes mellitus with hyperglycemia: Secondary | ICD-10-CM | POA: Diagnosis not present

## 2024-05-22 DIAGNOSIS — E7800 Pure hypercholesterolemia, unspecified: Secondary | ICD-10-CM | POA: Diagnosis not present

## 2024-05-29 DIAGNOSIS — Z23 Encounter for immunization: Secondary | ICD-10-CM | POA: Diagnosis not present

## 2024-05-29 DIAGNOSIS — I10 Essential (primary) hypertension: Secondary | ICD-10-CM | POA: Diagnosis not present

## 2024-05-29 DIAGNOSIS — Z0000 Encounter for general adult medical examination without abnormal findings: Secondary | ICD-10-CM | POA: Diagnosis not present

## 2024-05-29 DIAGNOSIS — F331 Major depressive disorder, recurrent, moderate: Secondary | ICD-10-CM | POA: Diagnosis not present

## 2024-05-29 DIAGNOSIS — Z1331 Encounter for screening for depression: Secondary | ICD-10-CM | POA: Diagnosis not present

## 2024-05-29 DIAGNOSIS — E1165 Type 2 diabetes mellitus with hyperglycemia: Secondary | ICD-10-CM | POA: Diagnosis not present

## 2024-05-29 DIAGNOSIS — I2510 Atherosclerotic heart disease of native coronary artery without angina pectoris: Secondary | ICD-10-CM | POA: Diagnosis not present

## 2024-05-29 DIAGNOSIS — E7800 Pure hypercholesterolemia, unspecified: Secondary | ICD-10-CM | POA: Diagnosis not present

## 2024-05-29 DIAGNOSIS — K760 Fatty (change of) liver, not elsewhere classified: Secondary | ICD-10-CM | POA: Diagnosis not present

## 2024-05-29 DIAGNOSIS — E538 Deficiency of other specified B group vitamins: Secondary | ICD-10-CM | POA: Diagnosis not present

## 2024-06-01 DIAGNOSIS — R8290 Unspecified abnormal findings in urine: Secondary | ICD-10-CM | POA: Diagnosis not present

## 2024-06-01 DIAGNOSIS — N390 Urinary tract infection, site not specified: Secondary | ICD-10-CM | POA: Diagnosis not present

## 2024-06-01 DIAGNOSIS — N9489 Other specified conditions associated with female genital organs and menstrual cycle: Secondary | ICD-10-CM | POA: Diagnosis not present

## 2024-06-20 DIAGNOSIS — I2510 Atherosclerotic heart disease of native coronary artery without angina pectoris: Secondary | ICD-10-CM | POA: Diagnosis not present

## 2024-06-20 DIAGNOSIS — R0609 Other forms of dyspnea: Secondary | ICD-10-CM | POA: Diagnosis not present

## 2024-06-20 DIAGNOSIS — R001 Bradycardia, unspecified: Secondary | ICD-10-CM | POA: Diagnosis not present

## 2024-06-20 DIAGNOSIS — R002 Palpitations: Secondary | ICD-10-CM | POA: Diagnosis not present

## 2024-06-20 DIAGNOSIS — E7800 Pure hypercholesterolemia, unspecified: Secondary | ICD-10-CM | POA: Diagnosis not present

## 2024-07-29 ENCOUNTER — Other Ambulatory Visit: Payer: Self-pay | Admitting: Internal Medicine

## 2024-07-29 DIAGNOSIS — Z1231 Encounter for screening mammogram for malignant neoplasm of breast: Secondary | ICD-10-CM

## 2024-08-04 DIAGNOSIS — R051 Acute cough: Secondary | ICD-10-CM | POA: Diagnosis not present

## 2024-08-13 DIAGNOSIS — E119 Type 2 diabetes mellitus without complications: Secondary | ICD-10-CM | POA: Diagnosis not present

## 2024-08-13 DIAGNOSIS — Z9889 Other specified postprocedural states: Secondary | ICD-10-CM | POA: Diagnosis not present

## 2024-08-13 DIAGNOSIS — H353132 Nonexudative age-related macular degeneration, bilateral, intermediate dry stage: Secondary | ICD-10-CM | POA: Diagnosis not present

## 2024-08-13 DIAGNOSIS — Z961 Presence of intraocular lens: Secondary | ICD-10-CM | POA: Diagnosis not present

## 2024-09-04 ENCOUNTER — Ambulatory Visit
Admission: RE | Admit: 2024-09-04 | Discharge: 2024-09-04 | Disposition: A | Discharge status: Home / Self Care | Source: Ambulatory Visit | Attending: Internal Medicine | Admitting: Internal Medicine

## 2024-09-04 DIAGNOSIS — Z1231 Encounter for screening mammogram for malignant neoplasm of breast: Secondary | ICD-10-CM | POA: Diagnosis present

## 2024-10-15 ENCOUNTER — Ambulatory Visit: Admitting: Neurosurgery
